# Patient Record
Sex: Female | Born: 1965 | Race: Black or African American | Hispanic: No | Marital: Married | Smoking: Never smoker
Health system: Southern US, Community
[De-identification: ages and names within clinical notes are randomized; demographics above are authoritative.]

## PROBLEM LIST (undated history)

## (undated) DIAGNOSIS — S42309A Unspecified fracture of shaft of humerus, unspecified arm, initial encounter for closed fracture: Secondary | ICD-10-CM

## (undated) DIAGNOSIS — D696 Thrombocytopenia, unspecified: Secondary | ICD-10-CM

## (undated) DIAGNOSIS — D649 Anemia, unspecified: Secondary | ICD-10-CM

## (undated) DIAGNOSIS — J302 Other seasonal allergic rhinitis: Secondary | ICD-10-CM

## (undated) DIAGNOSIS — R351 Nocturia: Secondary | ICD-10-CM

## (undated) HISTORY — DX: Anemia, unspecified: D64.9

## (undated) HISTORY — DX: Other seasonal allergic rhinitis: J30.2

## (undated) HISTORY — PX: COLONOSCOPY: SHX174

## (undated) HISTORY — PX: OTHER SURGICAL HISTORY: SHX169

## (undated) HISTORY — DX: Nocturia: R35.1

## (undated) HISTORY — DX: Thrombocytopenia, unspecified: D69.6

## (undated) HISTORY — DX: Unspecified fracture of shaft of humerus, unspecified arm, initial encounter for closed fracture: S42.309A

---

## 1998-07-28 ENCOUNTER — Ambulatory Visit (HOSPITAL_COMMUNITY): Admission: RE | Admit: 1998-07-28 | Discharge: 1998-07-28 | Payer: Self-pay | Admitting: Obstetrics and Gynecology

## 1998-08-01 ENCOUNTER — Inpatient Hospital Stay (HOSPITAL_COMMUNITY): Admission: AD | Admit: 1998-08-01 | Discharge: 1998-08-01 | Payer: Self-pay | Admitting: Obstetrics and Gynecology

## 1999-05-31 ENCOUNTER — Inpatient Hospital Stay (HOSPITAL_COMMUNITY): Admission: AD | Admit: 1999-05-31 | Discharge: 1999-05-31 | Payer: Self-pay | Admitting: Obstetrics and Gynecology

## 1999-06-01 ENCOUNTER — Ambulatory Visit (HOSPITAL_COMMUNITY): Admission: RE | Admit: 1999-06-01 | Discharge: 1999-06-01 | Payer: Self-pay | Admitting: Obstetrics and Gynecology

## 1999-06-01 ENCOUNTER — Encounter: Payer: Self-pay | Admitting: Obstetrics and Gynecology

## 1999-06-21 ENCOUNTER — Inpatient Hospital Stay (HOSPITAL_COMMUNITY): Admission: AD | Admit: 1999-06-21 | Discharge: 1999-06-24 | Payer: Self-pay | Admitting: Obstetrics and Gynecology

## 1999-06-21 ENCOUNTER — Encounter (INDEPENDENT_AMBULATORY_CARE_PROVIDER_SITE_OTHER): Payer: Self-pay | Admitting: *Deleted

## 1999-06-25 ENCOUNTER — Encounter (HOSPITAL_COMMUNITY): Admission: RE | Admit: 1999-06-25 | Discharge: 1999-07-26 | Payer: Self-pay | Admitting: Obstetrics and Gynecology

## 1999-08-03 ENCOUNTER — Other Ambulatory Visit: Admission: RE | Admit: 1999-08-03 | Discharge: 1999-08-03 | Payer: Self-pay | Admitting: Obstetrics and Gynecology

## 2000-05-11 ENCOUNTER — Other Ambulatory Visit: Admission: RE | Admit: 2000-05-11 | Discharge: 2000-05-11 | Payer: Self-pay | Admitting: Obstetrics and Gynecology

## 2001-05-31 ENCOUNTER — Other Ambulatory Visit: Admission: RE | Admit: 2001-05-31 | Discharge: 2001-05-31 | Payer: Self-pay | Admitting: Obstetrics and Gynecology

## 2002-06-06 ENCOUNTER — Other Ambulatory Visit: Admission: RE | Admit: 2002-06-06 | Discharge: 2002-06-06 | Payer: Self-pay | Admitting: Obstetrics and Gynecology

## 2003-06-19 ENCOUNTER — Other Ambulatory Visit: Admission: RE | Admit: 2003-06-19 | Discharge: 2003-06-19 | Payer: Self-pay | Admitting: Obstetrics and Gynecology

## 2004-06-10 ENCOUNTER — Encounter: Payer: Self-pay | Admitting: Cardiology

## 2004-06-10 ENCOUNTER — Ambulatory Visit (HOSPITAL_COMMUNITY): Admission: RE | Admit: 2004-06-10 | Discharge: 2004-06-10 | Payer: Self-pay | Admitting: Internal Medicine

## 2004-06-10 ENCOUNTER — Encounter: Payer: Self-pay | Admitting: Internal Medicine

## 2004-06-10 ENCOUNTER — Ambulatory Visit: Payer: Self-pay | Admitting: Cardiology

## 2004-07-07 ENCOUNTER — Other Ambulatory Visit: Admission: RE | Admit: 2004-07-07 | Discharge: 2004-07-07 | Payer: Self-pay | Admitting: Obstetrics & Gynecology

## 2005-06-23 ENCOUNTER — Encounter: Payer: Self-pay | Admitting: Internal Medicine

## 2005-06-23 LAB — CONVERTED CEMR LAB
ALT: 19 units/L
AST: 27 units/L
Albumin: 4.7 g/dL
Alkaline Phosphatase: 38 units/L
BUN: 13 mg/dL
CO2: 25 meq/L
Calcium: 9.8 mg/dL
Chloride: 102 meq/L
Cholesterol: 111 mg/dL
Creatinine, Ser: 1.1 mg/dL
Glucose, Bld: 82 mg/dL
HDL: 48 mg/dL
Hgb A1c MFr Bld: 5.4 %
LDL Cholesterol: 49 mg/dL
Potassium: 4.2 meq/L
Sodium: 136 meq/L
TSH: 1.846 microintl units/mL
Total Bilirubin: 0.5 mg/dL
Total Protein: 8.1 g/dL
Triglyceride fasting, serum: 70 mg/dL

## 2005-07-21 ENCOUNTER — Ambulatory Visit: Payer: Self-pay | Admitting: Gastroenterology

## 2005-08-04 ENCOUNTER — Ambulatory Visit: Payer: Self-pay | Admitting: Gastroenterology

## 2005-08-06 LAB — HM COLONOSCOPY: HM Colonoscopy: NORMAL

## 2006-06-29 ENCOUNTER — Encounter: Payer: Self-pay | Admitting: Internal Medicine

## 2006-06-29 LAB — CONVERTED CEMR LAB
ALT: 14 units/L
AST: 19 units/L
Albumin: 4.4 g/dL
Alkaline Phosphatase: 35 units/L
BUN: 14 mg/dL
Basophils Relative: 1 %
CO2: 25 meq/L
Calcium: 9.3 mg/dL
Chloride: 103 meq/L
Cholesterol: 127 mg/dL
Creatinine, Ser: 1.06 mg/dL
Eosinophils Relative: 2 %
Glucose, Bld: 83 mg/dL
HCT: 35.5 %
HDL: 48 mg/dL
Hemoglobin: 11.5 g/dL
Hgb A1c MFr Bld: 5.4 %
LDL Cholesterol: 66 mg/dL
Lymphocytes, automated: 44 %
MCV: 92.2 fL
Monocytes Relative: 8 %
Neutrophils Relative %: 45 %
Platelets: 179 10*3/uL
Potassium: 4.9 meq/L
RBC: 3.85 M/uL
RDW: 13.1 %
Sodium: 138 meq/L
TSH: 1.74 microintl units/mL
Total Bilirubin: 0.8 mg/dL
Total Protein: 7.6 g/dL
Triglyceride fasting, serum: 66 mg/dL
WBC: 3.7 10*3/uL

## 2007-07-05 ENCOUNTER — Encounter: Payer: Self-pay | Admitting: Internal Medicine

## 2009-09-15 ENCOUNTER — Ambulatory Visit: Payer: Self-pay | Admitting: Internal Medicine

## 2009-09-15 LAB — CONVERTED CEMR LAB
ALT: 20 units/L (ref 0–35)
AST: 25 units/L (ref 0–37)
Albumin: 4.1 g/dL (ref 3.5–5.2)
Alkaline Phosphatase: 36 units/L — ABNORMAL LOW (ref 39–117)
BUN: 19 mg/dL (ref 6–23)
Basophils Absolute: 0 10*3/uL (ref 0.0–0.1)
Basophils Relative: 0.5 % (ref 0.0–3.0)
Bilirubin Urine: NEGATIVE
Bilirubin, Direct: 0.2 mg/dL (ref 0.0–0.3)
CO2: 27 meq/L (ref 19–32)
Calcium: 9.2 mg/dL (ref 8.4–10.5)
Chloride: 108 meq/L (ref 96–112)
Cholesterol: 131 mg/dL (ref 0–200)
Creatinine, Ser: 1 mg/dL (ref 0.4–1.2)
Eosinophils Absolute: 0.1 10*3/uL (ref 0.0–0.7)
Eosinophils Relative: 2.5 % (ref 0.0–5.0)
GFR calc non Af Amer: 76.65 mL/min (ref >60)
Glucose, Bld: 85 mg/dL (ref 70–99)
HCT: 33.9 % — ABNORMAL LOW (ref 36.0–46.0)
HDL: 38.5 mg/dL — ABNORMAL LOW (ref >39.00)
Hemoglobin: 11.3 g/dL — ABNORMAL LOW (ref 12.0–15.0)
Ketones, ur: NEGATIVE mg/dL
LDL Cholesterol: 73 mg/dL (ref 0–99)
Leukocytes, UA: NEGATIVE
Lymphocytes Relative: 27.8 % (ref 12.0–46.0)
Lymphs Abs: 1.3 10*3/uL (ref 0.7–4.0)
MCHC: 33.3 g/dL (ref 30.0–36.0)
MCV: 93.8 fL (ref 78.0–100.0)
Monocytes Absolute: 0.3 10*3/uL (ref 0.1–1.0)
Monocytes Relative: 6.8 % (ref 3.0–12.0)
Neutro Abs: 3 10*3/uL (ref 1.4–7.7)
Neutrophils Relative %: 62.4 % (ref 43.0–77.0)
Nitrite: NEGATIVE
Platelets: 50 10*3/uL — ABNORMAL LOW (ref 150.0–400.0)
Potassium: 4.5 meq/L (ref 3.5–5.1)
RBC: 3.62 M/uL — ABNORMAL LOW (ref 3.87–5.11)
RDW: 12.7 % (ref 11.5–14.6)
Sodium: 140 meq/L (ref 135–145)
Specific Gravity, Urine: >=1.03 (ref 1.000–1.030)
TSH: 1.77 microintl units/mL (ref 0.35–5.50)
Total Bilirubin: 1 mg/dL (ref 0.3–1.2)
Total CHOL/HDL Ratio: 3
Total Protein, Urine: NEGATIVE mg/dL
Total Protein: 7.4 g/dL (ref 6.0–8.3)
Triglycerides: 99 mg/dL (ref 0.0–149.0)
Urine Glucose: NEGATIVE mg/dL
Urobilinogen, UA: 0.2 (ref 0.0–1.0)
VLDL: 19.8 mg/dL (ref 0.0–40.0)
WBC: 4.8 10*3/uL (ref 4.5–10.5)
pH: 5.5 (ref 5.0–8.0)

## 2009-09-21 ENCOUNTER — Ambulatory Visit: Payer: Self-pay | Admitting: Internal Medicine

## 2009-09-21 DIAGNOSIS — D696 Thrombocytopenia, unspecified: Secondary | ICD-10-CM

## 2009-09-22 DIAGNOSIS — D649 Anemia, unspecified: Secondary | ICD-10-CM | POA: Insufficient documentation

## 2009-09-29 ENCOUNTER — Telehealth: Payer: Self-pay | Admitting: Internal Medicine

## 2009-09-30 ENCOUNTER — Encounter: Payer: Self-pay | Admitting: Internal Medicine

## 2009-10-18 ENCOUNTER — Ambulatory Visit: Payer: Self-pay | Admitting: Internal Medicine

## 2009-10-18 LAB — CONVERTED CEMR LAB
Basophils Absolute: 0 10*3/uL (ref 0.0–0.1)
Basophils Relative: 0.8 % (ref 0.0–3.0)
Eosinophils Absolute: 0.1 10*3/uL (ref 0.0–0.7)
Eosinophils Relative: 2.2 % (ref 0.0–5.0)
HCT: 33.7 % — ABNORMAL LOW (ref 36.0–46.0)
Hemoglobin: 11.5 g/dL — ABNORMAL LOW (ref 12.0–15.0)
Lymphocytes Relative: 33.8 % (ref 12.0–46.0)
Lymphs Abs: 1.8 10*3/uL (ref 0.7–4.0)
MCHC: 34.2 g/dL (ref 30.0–36.0)
MCV: 93.4 fL (ref 78.0–100.0)
Monocytes Absolute: 0.5 10*3/uL (ref 0.1–1.0)
Monocytes Relative: 8.7 % (ref 3.0–12.0)
Neutro Abs: 2.9 10*3/uL (ref 1.4–7.7)
Neutrophils Relative %: 54.5 % (ref 43.0–77.0)
Platelets: 196 10*3/uL (ref 150.0–400.0)
RBC: 3.61 M/uL — ABNORMAL LOW (ref 3.87–5.11)
RDW: 13 % (ref 11.5–14.6)
WBC: 5.4 10*3/uL (ref 4.5–10.5)

## 2010-01-07 ENCOUNTER — Ambulatory Visit: Payer: Self-pay | Admitting: Internal Medicine

## 2010-01-07 DIAGNOSIS — R062 Wheezing: Secondary | ICD-10-CM

## 2010-04-10 LAB — CONVERTED CEMR LAB
ALT: 13 units/L
AST: 19 units/L
Albumin: 4.3 g/dL
Alkaline Phosphatase: 32 units/L
BUN: 13 mg/dL
Basophils Relative: 1 %
CO2: 24 meq/L
Calcium: 9.6 mg/dL
Chloride: 104 meq/L
Cholesterol: 124 mg/dL
Creatinine, Ser: 1 mg/dL
Eosinophils Relative: 1 %
Glucose, Bld: 77 mg/dL
HCT: 34.9 %
HDL: 55 mg/dL
Hemoglobin: 11.5 g/dL
Hgb A1c MFr Bld: 5.3 %
LDL Cholesterol: 56 mg/dL
Lymphocytes, automated: 42 %
MCV: 92.8 fL
Monocytes Relative: 10 %
Neutrophils Relative %: 47 %
Platelets: 205 10*3/uL
Potassium: 4.2 meq/L
RBC: 3.76 M/uL
RDW: 13.4 %
Sodium: 139 meq/L
TSH: 1.144 microintl units/mL
Total Bilirubin: 0.7 mg/dL
Total Protein: 7.6 g/dL
Triglyceride fasting, serum: 65 mg/dL
WBC: 4.9 10*3/uL

## 2010-04-14 NOTE — Assessment & Plan Note (Signed)
Summary: npx-lb   Vital Signs:  Patient profile:   45 year old female Height:      64 inches (162.56 cm) Weight:      164.0 pounds (74.55 kg) BMI:     28.25 O2 Sat:      97 % on Room air Temp:     98.5 degrees F (36.94 degrees C) oral Pulse rate:   64 / minute BP sitting:   120 / 72  (left arm) Cuff size:   regular  Vitals Entered By: Orlan Leavens (September 21, 2009 2:36 PM)  O2 Flow:  Room air CC: New patient CPX Is Patient Diabetic? No Pain Assessment Patient in pain? no        Primary Care Provider:  Newt Lukes MD  CC:  New patient CPX.  History of Present Illness: new pt to me and our practice, here to es care also, patient is here today for annual physical. Patient feels well and has no complaints.   thrombocytopenia - noted incidental on labs - pt denies hx same anemia - chronic hx same per pt - never required transfusion   Preventive Screening-Counseling & Management  Alcohol-Tobacco     Alcohol drinks/day: <1     Alcohol Counseling: not indicated; use of alcohol is not excessive or problematic     Smoking Status: never     Tobacco Counseling: not indicated; no tobacco use  Caffeine-Diet-Exercise     Diet Counseling: not indicated; diet is assessed to be healthy     Does Patient Exercise: yes     Exercise Counseling: not indicated; exercise is adequate     Depression Counseling: not indicated; screening negative for depression  Safety-Violence-Falls     Seat Belt Use: yes     Seat Belt Counseling: not indicated; patient wears seat belts     Helmet Use: yes     Helmet Counseling: not indicated; patient wears helmet when riding bicycle/motocycle     Firearms in the Home: firearms in the home     Firearm Counseling: not indicated; uses recommended firearm safety measures     Smoke Detectors: yes     Smoke Detector Counseling: n/a     Violence in the Home: no risk noted     Sexual Abuse: no     Fall Risk Counseling: not indicated; no significant  falls noted  Clinical Review Panels:  Prevention   Last Mammogram:  No specific mammographic evidence of malignancy.   (02/19/2009)   Last Pap Smear:  Interpretation/ Result:Negative for intraepithelial Lesion or Malignancy.    (02/19/2009)   Last Colonoscopy:  Location:  [facility].  Results: Normal. (08/04/2005)  Immunizations   Last Tetanus Booster:  Tdap (09/21/2009)  Lipid Management   Cholesterol:  131 (09/15/2009)   LDL (bad choesterol):  73 (09/15/2009)   HDL (good cholesterol):  38.50 (09/15/2009)  CBC   WBC:  4.8 (09/15/2009)   RBC:  3.62 (09/15/2009)   Hgb:  11.3 (09/15/2009)   Hct:  33.9 (09/15/2009)   Platelets:  50.0 (09/15/2009)   MCV  93.8 (09/15/2009)   MCHC  33.3 (09/15/2009)   RDW  12.7 (09/15/2009)   PMN:  62.4 (09/15/2009)   Lymphs:  27.8 (09/15/2009)   Monos:  6.8 (09/15/2009)   Eosinophils:  2.5 (09/15/2009)   Basophil:  0.5 (09/15/2009)  Complete Metabolic Panel   Glucose:  85 (09/15/2009)   Sodium:  140 (09/15/2009)   Potassium:  4.5 (09/15/2009)   Chloride:  108 (09/15/2009)  CO2:  27 (09/15/2009)   BUN:  19 (09/15/2009)   Creatinine:  1.0 (09/15/2009)   Albumin:  4.1 (09/15/2009)   Total Protein:  7.4 (09/15/2009)   Calcium:  9.2 (09/15/2009)   Total Bili:  1.0 (09/15/2009)   Alk Phos:  36 (09/15/2009)   SGPT (ALT):  20 (09/15/2009)   SGOT (AST):  25 (09/15/2009)   Current Medications (verified): 1)  One-A-Day Womens Formula  Tabs (Multiple Vitamins-Calcium) .... 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         considering breast reduction surg due to back and shoulder and neck pain; otherwise, see HPI above. I have reviewed all other systems and they were negative.   Physical Exam  General:  overweight-appearing.  alert, well-developed, well-nourished, and cooperative to examination.    Eyes:  vision grossly intact; pupils equal, round and reactive to light.  conjunctiva and lids normal.    Ears:  normal pinnae bilaterally, without erythema, swelling, or tenderness to palpation. TMs clear, without effusion, or cerumen impaction. Hearing grossly normal bilaterally  Nose:  External nasal examination shows no deformity or inflammation. Nasal mucosa are pink and moist without lesions or exudates. Mouth:  teeth and gums in good repair; mucous membranes moist, without lesions or ulcers. oropharynx clear without exudate, no erythema.  Neck:  supple, full ROM, no masses, no thyromegaly; no thyroid nodules or tenderness. no JVD or carotid bruits.   Lungs:  normal respiratory effort, no intercostal retractions or use of accessory muscles; normal breath sounds bilaterally - no crackles and no wheezes.    Heart:  normal rate, regular rhythm, no murmur, and no rub. BLE without edema.  Abdomen:  soft, non-tender, normal bowel sounds, no distention; no masses and no appreciable hepatomegaly or splenomegaly.   Genitalia:  defer to gyn Msk:  No deformity or scoliosis noted of thoracic or lumbar  spine.   Neurologic:  alert & oriented X3 and cranial nerves II-XII symetrically intact.  strength normal in all extremities, sensation intact to light touch, and gait normal. speech fluent without dysarthria or aphasia; follows commands with good comprehension.  Skin:  no rashes, vesicles, ulcers, or erythema. No nodules or irregularity to palpation. no ecchymoses and no petechiae.   Psych:  Oriented X3, memory intact for recent and remote, normally interactive, good eye contact, not anxious appearing, not depressed appearing, and not agitated.      Impression & Recommendations:  Problem # 1:  PREVENTIVE HEALTH CARE (ICD-V70.0) Patient has been counseled on age-appropriate routine health concerns for screening and prevention. These are reviewed and up-to-date. Immunizations are up-to-date or declined. Labs and  ECG reviewed.  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.5) new problem identified on CPX labs - pt unaware of any prior hx same - suspect chronic as no recent systemic iillness nad nontoxic/benign exam today - likely ITP will send for prior recrds from PCP to review - also recheck CBC 4-6 weeks - eval further as needed  advised pt to contact us sooner if systemic symptoms arise --  Problem # 3:  UNSPECIFIED ANEMIA (ICD-285.9) mild with hx same  recheck CBC few weeks as above given low plts -   Hgb: 11.3 (09/15/2009)   Hct: 33.9 (09/15/2009)   Platelets: 50.0 (09/15/2009) RBC: 3.62 (09/15/2009)   RDW: 12.7 (09/15/2009)   WBC: 4.8 (09/15/2009) MCV: 93.8 (09/15/2009)   MCHC: 33.3 (09/15/2009) TSH: 1.77 (09/15/2009)  Complete Medication List: 1)  One-a-day Womens Formula Tabs (Multiple vitamins-calcium) .... Take 1 by mouth once daily  Other Orders: Tdap => 56yrs IM (78469) Admin 1st Vaccine (62952)  Patient Instructions: 1)  it was good to see you today. 2)  exam, labs and EKG reviewed today - will look further into your low platelet  count on CBC as discussed - 3)  will send for prior records from Dr. Madelin Rear to review and compare 4)  return for lab only in 4-6 weeks (CBC  icd9: 287.5,2 85.9 ) - will call you with these results and workup plan (if needed) after review 5)  Please schedule a follow-up appointment annually for medical physical and labs, sooner if problems.  6)  stay active, exercise aerobically 67minutes/spell 4-5 times weekly - eat heart healthy diet   Mammogram  Procedure date:  02/19/2009  Findings:      No specific mammographic evidence of malignancy.    Pap Smear  Procedure date:  02/19/2009  Findings:      Interpretation/ Result:Negative for intraepithelial Lesion or Malignancy.     Colonoscopy  Procedure date:  08/04/2005  Findings:      Location:  [facility].  Results: Normal.     Immunizations Administered:  Tetanus Vaccine:    Vaccine Type: Tdap    Site: right deltoid    Mfr: GlaxoSmithKline    Dose: 0.5 ml    Route: IM    Given by: Orlan Leavens    Exp. Date: 06/04/2011    Lot #: [id number]    VIS given: 09/21/09

## 2010-04-14 NOTE — Assessment & Plan Note (Signed)
Summary: COUGH/ CHEST TIGHTNESS/ CONGESTION/NWS   Vital Signs:  Patient profile:   45 year old female Height:      64 inches Weight:      163.50 pounds O2 Sat:      97 % Temp:     98.1 degrees F oral Pulse rate:   64 / minute BP sitting:   130 / 70  (left arm) Cuff size:   large  Vitals Entered By: Jarome Lamas (January 07, 2010 3:33 PM) CC: cough and congestion/pb   Primary Care Provider:  Newt Lukes MD  CC:  cough and congestion/pb.  History of Present Illness: here with acute onset midl to mod headache, fever, ST and prod cough greenish sputum for 3 days, with onset today mild wheezing and sob, but Pt denies CP, wheezing, orthopnea, pnd, worsening LE edema, palps, dizziness or syncope . Pt denies polydipsia, polyuria.  Overall good compliance with meds.  Tried mucinex but little help so far.    Problems Prior to Update: 1)  Wheezing  (ICD-786.07) 2)  Bronchitis-acute  (ICD-466.0) 3)  Unspecified Anemia  (ICD-285.9) 4)  Thrombocytopenia  (ICD-287.5) 5)  Preventive Health Care  (ICD-V70.0) 6)  Family History Diabetes 1st Degree Relative  (ICD-V18.0)  Medications Prior to Update: 1)  One-A-Day Womens Formula  Tabs (Multiple Vitamins-Calcium) .... Take 1 By Mouth Once Daily  Current Medications (verified): 1)  One-A-Day Womens Formula  Tabs (Multiple Vitamins-Calcium) .... Take 1 By Mouth Once Daily 2)  Azithromycin 250 Mg Tabs (Azithromycin) .... 2po Qd For 1 Day, Then 1po Qd For 4days, Then Stop 3)  Prednisone 10 Mg Tabs (Prednisone) .... 3po Qd For 3days, Then 2po Qd For 3days, Then 1po Qd For 3days, Then Stop 4)  Hydrocodone-Homatropine 5-1.5 Mg/51ml Syrp (Hydrocodone-Homatropine) .... 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  , mild ill  Head:  normocephalic and atraumatic.   Eyes:  vision grossly intact, pupils equal, and pupils round.   Ears:  bilat tm's mild red, sinus nontender Nose:  nasal dischargemucosal pallor and mucosal edema.   Mouth:  pharyngeal erythema and fair dentition.   Neck:  supple and cervical lymphadenopathy.   Lungs:  normal respiratory effort, R decreased breath sounds, R wheezes, L decreased breath sounds, and L wheezes.   Heart:  normal rate and regular rhythm.   Extremities:  no edema, no erythema    Impression & Recommendations:  Problem # 1:  BRONCHITIS-ACUTE (ICD-466.0)  Her updated medication list for this problem includes:    Azithromycin 250 Mg Tabs (Azithromycin) .Marland Kitchen... 2po qd for 1 day, then 1po qd for 4days, then stop    Hydrocodone-homatropine 5-1.5 Mg/28ml Syrp (Hydrocodone-homatropine) .Marland Kitchen... 1/2 - 1 tsp by mouth q 6 hrs as needed cough treat as above, f/u any worsening signs or symptoms   Problem #  2:  WHEEZING (ICD-786.07) mild, due to above, for predpack asd   Complete Medication List: 1)  One-a-day Womens Formula Tabs (Multiple vitamins-calcium) .... Take 1 by mouth once daily 2)  Azithromycin 250 Mg Tabs (Azithromycin) .... 2po qd for 1 day, then 1po qd for 4days, then stop 3)  Prednisone 10 Mg Tabs (Prednisone) .... 3po qd for 3days, then 2po qd for 3days, then 1po qd for 3days, then stop 4)  Hydrocodone-homatropine 5-1.5 Mg/67ml Syrp (Hydrocodone-homatropine) .... 1/2 - 1 tsp by mouth q 6 hrs as needed  cough  Patient Instructions: 1)  Please take all new medications as prescribed 2)  Continue all previous medications as before this visit  3)  Please schedule a follow-up appointment as needed. Prescriptions: HYDROCODONE-HOMATROPINE 5-1.5 MG/5ML SYRP (HYDROCODONE-HOMATROPINE) 1/2 - 1 tsp by mouth q 6 hrs as needed cough  #6oz x 1   Entered and Authorized by:   Corwin Levins MD   Signed by:   Corwin Levins MD on 01/07/2010   Method used:   Print then Give to Patient   RxID:   [id number] PREDNISONE 10 MG TABS (PREDNISONE) 3po qd for 3days, then 2po qd for 3days, then 1po qd for 3days, then stop  #18 x 0   Entered and Authorized by:   Corwin Levins MD   Signed by:   Corwin Levins MD on 01/07/2010   Method used:   Print then Give to Patient   RxID:   [id number] AZITHROMYCIN 250 MG TABS (AZITHROMYCIN) 2po qd for 1 day, then 1po qd for 4days, then stop  #6 x 1   Entered and Authorized by:   Corwin Levins MD   Signed by:   Corwin Levins MD on 01/07/2010   Method used:   Print then Give to Patient   RxID:   [id number]    Orders Added: 1)  Est. Patient Level IV [74259]

## 2010-04-14 NOTE — Progress Notes (Signed)
Summary: med records  Phone Note Outgoing Call   Call placed by: Orlan Leavens,  September 29, 2009 2:40 PM Call placed to: Patient Summary of Call: Called pt to let her know md recieved records that was drop off for her and her husband yesterday. MD has made copy of everything that she need. calling them so they can pick chart back up for there future references. Had to leave msg to give me a call back. Initial call taken by: Orlan Leavens,  September 29, 2009 2:42 PM  Follow-up for Phone Call        Pt return call back. Let her know records will be left at front desk for her to pick back up Follow-up by: Orlan Leavens,  September 29, 2009 2:57 PM

## 2010-04-14 NOTE — Miscellaneous (Signed)
Summary: Orders Update  Clinical Lists Changes  Orders: Added new Service order of Est. Patient Level III (04540) - Signed     instead of the level 4 - done in error Corwin Levins MD  January 07, 2010 7:46 PM

## 2010-07-29 NOTE — Discharge Summary (Signed)
[facility]  Patient:    Brandy Salas, Brandy Salas                MRN: [id number] Adm. Date:  [id number] Disc. Date: [id number] Attending:  Miguel Aschoff Dictator:   Leilani Able, P.A.                           Discharge Summary  FINAL DIAGNOSES:              1. Intrauterine twin pregnancy at [redacted] weeks gestation.                               2. Vertex/breech presentation.  PROCEDURE:                    Primary low transverse cesarean section.  SURGEON:                      Miguel Aschoff, M.D.  ASSISTANT:                    Luvenia Redden, M.D.  COMPLICATIONS:                None.  HISTORY OF PRESENT ILLNESS:   This 45 year old, gravida 5, para 1-0-3-1, conceived on Clomid with this pregnancy and now has an intrauterine twin gestation with an [facility] of July 04, 1999.  Ultrasound has revealed that baby A was vertex and baby B is breech.  Because of the patients concerns of undergoing a cesarean section for the delivery of the second twin, the patient requested a primary cesarean section be carried out at this point.  HOSPITAL COURSE:              The patient was admitted on June 21, 1999, and taken to the operating room by Miguel Aschoff, M.D. for a primary low transverse cesarean  section.  The patient had delivery of twin A who was a 5 pound 9 ounce female infant with Apgars of 9 and 9, twin B was also a female infant weighing 5 pounds 11 ounces with Apgars of 8 and 9.  The pathology report showed a diamniotic, monochorionic twin placenta.  The deliveries went without complications.  The patients postoperative course was benign without significant fevers.  The patient was felt ready for discharge on postoperative day #3.  She was sent home on a regular diet, told to decrease activities, told to continue prenatal vitamins, nd FeSO4 for some maternal anemia.  She was given Tylox #30 one to two every four hours as needed for pain, and told to follow  up in the office in four weeks.  DISCHARGE LABORATORY DATA:    The patient had a hemoglobin of 8.2, white blood ell count of 7.7. DD:  07/10/99 TD:  07/13/99 Job: [id number]

## 2010-07-29 NOTE — Op Note (Signed)
[facility]  Patient:    Brandy Salas, Brandy Salas                MRN: [id number] Proc. Date: 06/21/99 Adm. Date:  [id number] Attending:  Miguel Aschoff                           Operative Report  OFFICE CHART NUMBER:          [id number]  PREOPERATIVE DIAGNOSIS:       Intrauterine twin pregnancy at 38 weeks with vertex/breech presentation.  POSTOPERATIVE DIAGNOSIS:      Intrauterine twin pregnancy at 38 weeks with vertex/breech presentation.  With delivery of viable female infants, baby A Apgars 8 and 9, baby B Apgars 8 and 9.  OPERATION:                    Primary low transverse cesarean section.  SURGEON:                      Miguel Aschoff, M.D.  ASSISTANT:                    Luvenia Redden, M.D.  ANESTHESIA:                   Epidural.  COMPLICATIONS:                None.  ESTIMATED BLOOD LOSS:  INDICATIONS:                  The patient is a 45 year old black female, gravida 5, para 1-0-3-1, who conceived on Clomid and now has intrauterine twin gestation with an [facility] of July 04, 1999.  Ultrasound revealed the baby A to be vertex and baby B to be breech.  Because of the patients concerns of undergoing a cesarean section for the delivery of the second twin, the patient requests that a primary cesarean section be carried out rather than undergoing emergency cesarean section for delivery of the second infant.  After the risks and benefits of each mode of delivery were discussed with the patient, she opted to undergo elective primary  cesarean section.  Informed consent has been obtained.  DESCRIPTION OF PROCEDURE:     The patient was taken to the operating room and placed in the supine position and then placed on her right side.  The epidural anesthesia was placed without difficulty.  After a satisfactory level of anesthesia was achieved, she was prepped and draped in the usual sterile fashion.  A Foley  catheter was inserted.  At this point a  Pfannenstiel incision was made and extended down through the subcutaneous tissue with bleeding points being clamped and coagulated as they were encountered.  The fascia was then identified and incised transversely and separated from the underlying rectus muscles.  The rectus muscles were divided in the midline.  The peritoneum was then identified and entered carefully avoiding underlying structures.  The peritoneal incision was then extended under direct visualization.  The bladder flap was then created with the and protected with the bladder blade.  An elliptical transverse incision was made into the lower uterine segment.  The amniotic cavity of baby A was entered and his baby was delivered as a viable female infant, Apgars 8 at one minute and 9 at five minutes from a vertex LOP position.  The baby was handed to the pediatric team n attendance.  Baby B was then found to be transverse and was easily rotated into a vertex presentation and delivered without difficulty.  It was also a viable female infant, Apgars 8 at one minute and 9 at five minutes.  Cord bloods were then obtained for appropriate studies.  The uterus was evacuated of any remaining products of conception.  The angles of the uterine incision were then ligated using figure-of-eight sutures of #1 Vicryl.  The uterus was closed in layers.  The first layer was a running interlocking suture of #1 Vicryl followed by an imbricating  suture of #1 Vicryl.  The bladder flap was reapproximated using running continuous 2-0 Vicryl suture.  The ovaries were inspected and noted to be within normal limits.  The right tube was noted to be within normal limits.  Lap counts were taken and found to be correct and at this point the abdomen was closed.  Prior o closure, it was irrigated with copious amounts of warm saline and the parietoperitoneum was closed using running continuous 0 Vicryl suture.  The rectus muscles were  reapproximated using running continuous 0 Vicryl suture.  The fascia was closed using two sutures of 0 Vicryl each starting at the lateral fascial angles and meeting in the midline.  The subcutaneous tissue and skin were closed using staples.  The estimated blood loss was approximately 700 to 800 cc.  The patient tolerated the procedure well and went to the recovery room in satisfactory condition.  The babies were taken to the nursery in satisfactory condition. DD:  06/21/99 TD:  06/21/99 Job: [id number]

## 2010-09-20 ENCOUNTER — Encounter: Payer: Self-pay | Admitting: Internal Medicine

## 2010-09-20 ENCOUNTER — Other Ambulatory Visit (INDEPENDENT_AMBULATORY_CARE_PROVIDER_SITE_OTHER): Payer: Self-pay

## 2010-09-20 ENCOUNTER — Other Ambulatory Visit: Payer: Self-pay | Admitting: Internal Medicine

## 2010-09-20 DIAGNOSIS — Z0000 Encounter for general adult medical examination without abnormal findings: Secondary | ICD-10-CM

## 2010-09-20 LAB — URINALYSIS
Bilirubin Urine: NEGATIVE
Hgb urine dipstick: NEGATIVE
Ketones, ur: NEGATIVE
Nitrite: NEGATIVE
Total Protein, Urine: NEGATIVE
Urine Glucose: NEGATIVE
pH: 6.5 (ref 5.0–8.0)

## 2010-09-20 LAB — LIPID PANEL
Cholesterol: 138 mg/dL (ref 0–200)
HDL: 49.3 mg/dL (ref >39.00)
Triglycerides: 64 mg/dL (ref 0.0–149.0)
VLDL: 12.8 mg/dL (ref 0.0–40.0)

## 2010-09-20 LAB — CBC WITH DIFFERENTIAL/PLATELET
Basophils Absolute: 0.1 10*3/uL (ref 0.0–0.1)
Eosinophils Absolute: 0.1 10*3/uL (ref 0.0–0.7)
Hemoglobin: 11.4 g/dL — ABNORMAL LOW (ref 12.0–15.0)
Lymphocytes Relative: 31.4 % (ref 12.0–46.0)
MCHC: 34.3 g/dL (ref 30.0–36.0)
Monocytes Relative: 9.9 % (ref 3.0–12.0)
Neutro Abs: 2.7 10*3/uL (ref 1.4–7.7)
Platelets: 181 10*3/uL (ref 150.0–400.0)
RDW: 13.7 % (ref 11.5–14.6)

## 2010-09-20 LAB — HEPATIC FUNCTION PANEL
ALT: 25 U/L (ref 0–35)
AST: 29 U/L (ref 0–37)
Albumin: 4.2 g/dL (ref 3.5–5.2)
Alkaline Phosphatase: 32 U/L — ABNORMAL LOW (ref 39–117)
Total Protein: 7.4 g/dL (ref 6.0–8.3)

## 2010-09-20 LAB — BASIC METABOLIC PANEL
Calcium: 9.2 mg/dL (ref 8.4–10.5)
GFR: 72.93 mL/min (ref >60.00)
Glucose, Bld: 75 mg/dL (ref 70–99)
Potassium: 4.4 mEq/L (ref 3.5–5.1)
Sodium: 139 mEq/L (ref 135–145)

## 2010-09-20 LAB — TSH: TSH: 1.94 u[IU]/mL (ref 0.35–5.50)

## 2010-09-26 ENCOUNTER — Encounter: Payer: Self-pay | Admitting: Internal Medicine

## 2010-10-06 ENCOUNTER — Encounter: Payer: Self-pay | Admitting: Internal Medicine

## 2010-10-06 ENCOUNTER — Ambulatory Visit (INDEPENDENT_AMBULATORY_CARE_PROVIDER_SITE_OTHER): Payer: 59 | Admitting: Internal Medicine

## 2010-10-06 VITALS — BP 128/88 | HR 72 | Temp 98.5°F | Ht 64.0 in | Wt 166.4 lb

## 2010-10-06 DIAGNOSIS — Z0000 Encounter for general adult medical examination without abnormal findings: Secondary | ICD-10-CM

## 2010-10-06 DIAGNOSIS — E663 Overweight: Secondary | ICD-10-CM

## 2010-10-06 MED ORDER — PHENTERMINE HCL 37.5 MG PO CAPS: 37.5000 mg | ORAL_CAPSULE | ORAL | Status: DC

## 2010-10-06 NOTE — Assessment & Plan Note (Signed)
Pt request phentermine to help "kick start" new plan to lose 20# -  reviewed risk/benefit discussed - max 3 consecutive months and need for recheck of weight and BP, symptoms review on meds - pt understands and agrees to same - 1st of 3 rx done today  Wt Readings from Last 3 Encounters:  10/06/10 166 lb 6.4 oz (75.479 kg)  01/07/10 163 lb 8 oz (74.163 kg)  09/21/09 164 lb (74.39 kg)

## 2010-10-06 NOTE — Progress Notes (Signed)
Subjective:    Patient ID: Brandy Salas, female    DOB: [birth year], 45 y.o.   MRN: [id number]  HPI patient is here today for annual physical. Patient feels well and has no complaints.  Overweight - would like med help to lose 20# - considering diet/exercise plans but none started yet  Past Medical History  Diagnosis Date  . THROMBOCYTOPENIA   . Anemia    Family History  Problem Relation Age of Onset  . Diabetes Mother   . Hypertension Mother   . Hypertension Father   . Diabetes Other     Parent  . Hypertension Other     parent, grandparent  . Prostate cancer Other     grandparent   History  Substance Use Topics  . Smoking status: Never Smoker   . Smokeless tobacco: Not on file   Comment: Married lives with spouse, teenage son and twin dtrs  . Alcohol Use: No   Review of Systems Constitutional: Negative for fever.  Respiratory: Negative for cough and shortness of breath.   Cardiovascular: Negative for chest pain.  Gastrointestinal: Negative for abdominal pain.  Musculoskeletal: Negative for gait problem.  Skin: Negative for rash.  Neurological: Negative for dizziness.  No other specific complaints in a complete review of systems (except as listed in HPI above).     Objective:   Physical Exam BP 128/88  Pulse 72  Temp(Src) 98.5 F (36.9 C) (Oral)  Ht 5\' 4"  (1.626 m)  Wt 166 lb 6.4 oz (75.479 kg)  BMI 28.56 kg/m2  SpO2 98% Wt Readings from Last 3 Encounters:  10/06/10 166 lb 6.4 oz (75.479 kg)  01/07/10 163 lb 8 oz (74.163 kg)  09/21/09 164 lb (74.39 kg)   Physical Exam  Constitutional: She is oriented to person, place, and time. She appears well-developed and well-nourished. No distress.  HENT: Head: Normocephalic and atraumatic. Ears; B TMs ok, no erythema or effusion; Nose: Nose normal.  Mouth/Throat: Oropharynx is clear and moist. No oropharyngeal exudate.  Eyes: Conjunctivae and EOM are normal. Pupils are equal, round, and reactive to light.  No scleral icterus.  Neck: Normal range of motion. Neck supple. No JVD present. No thyromegaly present.  Cardiovascular: Normal rate, regular rhythm and normal heart sounds.  No murmur heard. No BLE edema. Pulmonary/Chest: Effort normal and breath sounds normal. No respiratory distress. She has no wheezes.  Abdominal: Soft. Bowel sounds are normal. She exhibits no distension. There is no tenderness.  Musculoskeletal: Normal range of motion, no joint effusions. No gross deformities Neurological: She is alert and oriented to person, place, and time. No cranial nerve deficit. Coordination normal.  Skin: Skin is warm and dry. No rash noted. No erythema.  Psychiatric: She has a normal mood and affect. Her behavior is normal. Judgment and thought content normal.   Lab Results  Component Value Date   WBC 4.8 09/20/2010   HGB 11.4* 09/20/2010   HCT 33.3* 09/20/2010   PLT 181.0 09/20/2010   CHOL 138 09/20/2010   TRIG 64.0 09/20/2010   HDL 49.30 09/20/2010   ALT 25 09/20/2010   AST 29 09/20/2010   NA 139 09/20/2010   K 4.4 09/20/2010   CL 107 09/20/2010   CREATININE 1.0 09/20/2010   BUN 12 09/20/2010   CO2 28 09/20/2010   TSH 1.94 09/20/2010   HGBA1C 5.3 07/05/2007   EKG -NSR 66bpm - nonsp T wave change     Assessment & Plan:  CPX - v70.0 - Patient has been  counseled on age-appropriate routine health concerns for screening and prevention. These are reviewed and up-to-date. Immunizations are up-to-date or declined. Labs and ECG reviewed.

## 2010-10-06 NOTE — Patient Instructions (Signed)
It was good to see you today. Exam, EKG and labs look great - Ok to use phentermine to help stimulate weight loss efforts - 30day supply given today (1st of up to 3 months as discussed) Will needed recheck office visit for weight, blood pressure check and symptoms review before refills will be given Also work on lifestyle changes as discussed (low fat, low carb, increased protein diet; improved exercise efforts; weight loss) to control sugar, blood pressure and cholesterol levels and/or reduce risk of developing other medical problems. Look into [email or web address][employer][email or web address] or other type of food journal to assist you in this process. Please schedule followup in 12 months for physical and labs, call sooner if problems.

## 2011-04-24 ENCOUNTER — Ambulatory Visit (INDEPENDENT_AMBULATORY_CARE_PROVIDER_SITE_OTHER): Payer: 59 | Admitting: Internal Medicine

## 2011-04-24 ENCOUNTER — Encounter: Payer: Self-pay | Admitting: *Deleted

## 2011-04-24 ENCOUNTER — Encounter: Payer: Self-pay | Admitting: Internal Medicine

## 2011-04-24 VITALS — BP 122/82 | HR 79 | Temp 98.4°F

## 2011-04-24 DIAGNOSIS — J069 Acute upper respiratory infection, unspecified: Secondary | ICD-10-CM

## 2011-04-24 MED ORDER — IPRATROPIUM BROMIDE 0.03 % NA SOLN: 2.0000 | Freq: Three times a day (TID) | NASAL | Status: DC

## 2011-04-24 MED ORDER — AZITHROMYCIN 250 MG PO TABS: ORAL_TABLET | ORAL | Status: AC

## 2011-04-24 MED ORDER — HYDROCOD POLST-CHLORPHEN POLST 10-8 MG/5ML PO LQCR: 5.0000 mL | Freq: Two times a day (BID) | ORAL | Status: DC | PRN

## 2011-04-24 NOTE — Patient Instructions (Signed)
It was good to see you today. If you develop worsening symptoms or fever,  we can reconsider antibiotics (printed prescription for Zpak given to you today to use if symptoms worse), but it does not appear necessary to use antibiotics at this time. Atrovent nasal spray and Tussionex cough syrup - Your prescription(s) have been submitted to your pharmacy. Please take as directed and contact our office if you believe you are having problem(s) with the medication(s). Work note as requested Alternate between ibuprofen and tylenol for aches, pain and fever symptoms as discussed Hydrate, rest and call if worse

## 2011-04-24 NOTE — Progress Notes (Signed)
  Subjective:    HPI  complains of cold symptoms  Onset >1 week ago, wax/wane symptoms  associated with rhinorrhea, sneezing, sore throat, mild headache and low grade fever Also myalgias, sinus pressure and mild-mod chest congestion No relief with OTC meds Precipitated by sick contacts  Past Medical History  Diagnosis Date  . THROMBOCYTOPENIA   . Anemia     Review of Systems Constitutional: No fever or night sweats, no unexpected weight change Pulmonary: No pleurisy or hemoptysis Cardiovascular: No chest pain or palpitations     Objective:   Physical Exam BP 122/82  Pulse 79  Temp(Src) 98.4 F (36.9 C) (Oral)  SpO2 99% GEN: mildly ill appearing and audible head/chest congestion HENT: NCAT, mild sinus tenderness bilaterally, nares with clear discharge, oropharynx mild erythema, no exudate Eyes: Vision grossly intact, no conjunctivitis Lungs: Clear to auscultation without rhonchi or wheeze, no increased work of breathing Cardiovascular: Regular rate and rhythm, no bilateral edema  Lab Results  Component Value Date   WBC 4.8 09/20/2010   HGB 11.4* 09/20/2010   HCT 33.3* 09/20/2010   PLT 181.0 09/20/2010   GLUCOSE 75 09/20/2010   CHOL 138 09/20/2010   TRIG 64.0 09/20/2010   HDL 49.30 09/20/2010   LDLCALC 76 09/20/2010   ALT 25 09/20/2010   AST 29 09/20/2010   NA 139 09/20/2010   K 4.4 09/20/2010   CL 107 09/20/2010   CREATININE 1.0 09/20/2010   BUN 12 09/20/2010   CO2 28 09/20/2010   TSH 1.94 09/20/2010   HGBA1C 5.3 07/05/2007       Assessment & Plan:  Viral URI  Cough, postnasal drip related to above   Explained lack of efficacy for antibiotics in viral disease, but written prescription for Zpak prescribed to use if symptom duration greater than 7 days Prescription cough suppression and atrovent nasal spray - new prescriptions done Symptomatic care with Tylenol or Advil, hydration and rest -  salt gargle advised as needed Work note provided as needed

## 2011-05-29 ENCOUNTER — Ambulatory Visit (INDEPENDENT_AMBULATORY_CARE_PROVIDER_SITE_OTHER): Payer: 59 | Admitting: Internal Medicine

## 2011-05-29 ENCOUNTER — Encounter: Payer: Self-pay | Admitting: Internal Medicine

## 2011-05-29 ENCOUNTER — Ambulatory Visit (INDEPENDENT_AMBULATORY_CARE_PROVIDER_SITE_OTHER)
Admission: RE | Admit: 2011-05-29 | Discharge: 2011-05-29 | Disposition: A | Discharge status: Home / Self Care | Payer: 59 | Source: Ambulatory Visit | Attending: Internal Medicine | Admitting: Internal Medicine

## 2011-05-29 VITALS — BP 130/88 | HR 69 | Temp 97.4°F | Ht 66.0 in | Wt 155.0 lb

## 2011-05-29 DIAGNOSIS — R05 Cough: Secondary | ICD-10-CM

## 2011-05-29 MED ORDER — HYDROCOD POLST-CHLORPHEN POLST 10-8 MG/5ML PO LQCR: 5.0000 mL | Freq: Two times a day (BID) | ORAL | Status: DC | PRN

## 2011-05-29 NOTE — Patient Instructions (Signed)
It was good to see you today. Tussionex cough syrup - use before bedtime x 1 week, then as needed- Your prescription(s) have been submitted to your pharmacy. Please take as directed and contact our office if you believe you are having problem(s) with the medication(s). Mucinex during day x 1 week, then as needed Test(s) ordered today. Your results will be called to you after review (48-72hours after test completion). If any changes need to be made, you will be notified at that time. If symptoms unimproved in next few days on this treatment, call for prednisone taper as discussed

## 2011-05-29 NOTE — Progress Notes (Signed)
  Subjective:    Patient ID: Brandy Salas, female    DOB: [birth year], 46 y.o.   MRN: [id number]  HPI complains of continued cough Seen here 04/24/11 for same - did not ever fill Tussionex or Atrovent nasal spray - Took Zpak last week - no change in cough symptoms Runny nose and myalgias have resolved - persisting fatigue New dyspnea on exertion and shortness of breath No hx asthma, + allergies  Past Medical History  Diagnosis Date  . THROMBOCYTOPENIA   . Anemia     Review of Systems  Constitutional: Negative for fever and unexpected weight change.  HENT: Positive for postnasal drip. Negative for sneezing and sinus pressure.   Respiratory: Positive for cough and shortness of breath. Negative for wheezing.        Objective:   Physical Exam BP 130/88  Pulse 69  Temp(Src) 97.4 F (36.3 C) (Oral)  Ht 5\' 6"  (1.676 m)  Wt 155 lb (70.308 kg)  BMI 25.02 kg/m2  SpO2 99% Wt Readings from Last 3 Encounters:  05/29/11 155 lb (70.308 kg)  10/06/10 166 lb 6.4 oz (75.479 kg)  01/07/10 163 lb 8 oz (74.163 kg)    General: Nontoxic, in no acute distress HENT: Sinuses nontender, oropharynx with clear white postnasal drip, red but no exudate - TMs clear bilaterally Eyes: No conjunctivitis, PERRLA, EOMI Lungs: Clear to auscultation bilaterally, no rhonchi, wheeze or crackle Cardiovascular: Regular rate and rhythm  Lab Results  Component Value Date   WBC 4.8 09/20/2010   HGB 11.4* 09/20/2010   HCT 33.3* 09/20/2010   PLT 181.0 09/20/2010   GLUCOSE 75 09/20/2010   CHOL 138 09/20/2010   TRIG 64.0 09/20/2010   HDL 49.30 09/20/2010   LDLCALC 76 09/20/2010   ALT 25 09/20/2010   AST 29 09/20/2010   NA 139 09/20/2010   K 4.4 09/20/2010   CL 107 09/20/2010   CREATININE 1.0 09/20/2010   BUN 12 09/20/2010   CO2 28 09/20/2010   TSH 1.94 09/20/2010   HGBA1C 5.3 07/05/2007         Assessment & Plan:  Cough, dry but persisting symptoms greater than one month -  O2 sats normal and lung exam  clear  Did not take Atrovent nasal spray or prior Tussionex due to confusion about medications during February 2013 OV (reviewed in depth today prior instructions and subsequent confusion)  Suspect post viral cough with element of reactive airway disease Will check chest x-ray given chronicity of symptoms and patient personal history of bronchitis and pneumonia (patient request imaging as well) Has completed Z-Pak antibiotics, hold further antibiotics unless abnormality on imaging as patient is afebrile Treat with cough suppression: Tussionex at bedtime and Mucinex during the day If symptoms unimproved and if no evidence of infection on chest x-ray, consider prednisone taper for reactive airway disease with albuterol inhaler - reviewed same with patient in depth today - patient will call if symptoms worse

## 2011-09-22 ENCOUNTER — Other Ambulatory Visit (INDEPENDENT_AMBULATORY_CARE_PROVIDER_SITE_OTHER): Payer: 59

## 2011-09-22 ENCOUNTER — Telehealth: Payer: Self-pay | Admitting: *Deleted

## 2011-09-22 DIAGNOSIS — Z0000 Encounter for general adult medical examination without abnormal findings: Secondary | ICD-10-CM

## 2011-09-22 LAB — CBC WITH DIFFERENTIAL/PLATELET
Basophils Relative: 0.7 % (ref 0.0–3.0)
Eosinophils Absolute: 0.1 10*3/uL (ref 0.0–0.7)
Hemoglobin: 11.7 g/dL — ABNORMAL LOW (ref 12.0–15.0)
Lymphocytes Relative: 44 % (ref 12.0–46.0)
MCHC: 32.4 g/dL (ref 30.0–36.0)
Neutro Abs: 1.7 10*3/uL (ref 1.4–7.7)
RBC: 3.92 Mil/uL (ref 3.87–5.11)

## 2011-09-22 LAB — URINALYSIS, ROUTINE W REFLEX MICROSCOPIC
Hgb urine dipstick: NEGATIVE
Specific Gravity, Urine: >=1.03 (ref 1.000–1.030)
Total Protein, Urine: NEGATIVE
Urine Glucose: NEGATIVE
pH: 5.5 (ref 5.0–8.0)

## 2011-09-22 LAB — LIPID PANEL
HDL: 50.7 mg/dL (ref >39.00)
Total CHOL/HDL Ratio: 3

## 2011-09-22 LAB — BASIC METABOLIC PANEL
CO2: 28 mEq/L (ref 19–32)
Calcium: 9.4 mg/dL (ref 8.4–10.5)
Chloride: 108 mEq/L (ref 96–112)
Sodium: 141 mEq/L (ref 135–145)

## 2011-09-22 LAB — HEPATIC FUNCTION PANEL
Bilirubin, Direct: 0.1 mg/dL (ref 0.0–0.3)
Total Bilirubin: 0.8 mg/dL (ref 0.3–1.2)

## 2011-09-22 NOTE — Telephone Encounter (Signed)
Pt in lab needing cpx labs done. No order in comp. Inform anita will put orders in epic... 09/22/11@11 :54am/LMB

## 2011-10-02 ENCOUNTER — Other Ambulatory Visit: Payer: 59

## 2011-10-09 ENCOUNTER — Ambulatory Visit (INDEPENDENT_AMBULATORY_CARE_PROVIDER_SITE_OTHER): Payer: 59 | Admitting: Internal Medicine

## 2011-10-09 ENCOUNTER — Encounter: Payer: Self-pay | Admitting: Internal Medicine

## 2011-10-09 VITALS — BP 112/72 | HR 70 | Temp 98.4°F | Ht 64.0 in | Wt 158.1 lb

## 2011-10-09 DIAGNOSIS — Z0000 Encounter for general adult medical examination without abnormal findings: Secondary | ICD-10-CM

## 2011-10-09 DIAGNOSIS — E663 Overweight: Secondary | ICD-10-CM

## 2011-10-09 MED ORDER — PHENTERMINE HCL 37.5 MG PO CAPS: 37.5000 mg | ORAL_CAPSULE | ORAL | Status: DC

## 2011-10-09 NOTE — Progress Notes (Signed)
Subjective:    Patient ID: Brandy Salas, female    DOB: October 08, 1965, 46 y.o.   MRN: [id number]  HPI patient is here today for annual physical. Patient feels well and has no complaints.  Overweight - would like to resume phentermine to help to lose weight. Used x 30d 09/2010 - no advere side effects - has changed diet patterns - considering exercise plans but none started yet  Past Medical History  Diagnosis Date  . THROMBOCYTOPENIA   . Anemia    Family History  Problem Relation Age of Onset  . Diabetes Mother   . Hypertension Mother   . Hypertension Father   . Prostate cancer Other     grandparent   History  Substance Use Topics  . Smoking status: Never Smoker   . Smokeless tobacco: Not on file   Comment: Married lives with spouse, teenage son and twin dtrs. 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 Alcohol Use: No    Review of Systems Constitutional: Negative for fever or unexpected weight change.  Respiratory: Negative for cough and shortness of breath.   Cardiovascular: Negative for chest pain or palpitations.  Gastrointestinal: Negative for abdominal pain, no bowel changes.  Musculoskeletal: Negative for gait problem or joint swelling.  Skin: Negative for rash.  Neurological: Negative for dizziness or headache.  No other specific complaints in a complete review of systems (except as listed in HPI above).     Objective:   Physical Exam  BP 112/72  Pulse 70  Temp 98.4 F (36.9 C) (Oral)  Ht 5\' 4"  (1.626 m)  Wt 158 lb 1.9 oz (71.723 kg)  BMI 27.14 kg/m2  SpO2 99% Wt Readings from Last 3 Encounters:  10/09/11 158 lb 1.9 oz (71.723 kg)  05/29/11 155 lb (70.308 kg)  10/06/10 166 lb 6.4 oz (75.479 kg)   Constitutional: She appears well-developed and well-nourished. No distress.  HENT: Head: Normocephalic and atraumatic. Ears: B TMs ok, no erythema or effusion; Nose: Nose normal. Mouth/Throat: Oropharynx is clear and moist. No  oropharyngeal exudate.  Eyes: Conjunctivae and EOM are normal. Pupils are equal, round, and reactive to light. No scleral icterus.  Neck: Normal range of motion. Neck supple. No JVD present. No thyromegaly present.  Cardiovascular: Normal rate, regular rhythm and normal heart sounds.  No murmur heard. No BLE edema. Pulmonary/Chest: Effort normal and breath sounds normal. No respiratory distress. She has no wheezes.  Abdominal: Soft. Bowel sounds are normal. She exhibits no distension. There is no tenderness. no masses Musculoskeletal: Normal range of motion, no joint effusions. No gross deformities Neurological: She is alert and oriented to person, place, and time. No cranial nerve deficit. Coordination normal.  Skin: Skin is warm and dry. No rash noted. No erythema.  Psychiatric: She has a normal mood and affect. Her behavior is normal. Judgment and thought content normal.   Lab Results  Component Value Date   WBC 4.0* 09/22/2011   HGB 11.7* 09/22/2011   HCT 36.2 09/22/2011   PLT 198.0 09/22/2011   GLUCOSE 91 09/22/2011   CHOL 143 09/22/2011   TRIG 65.0 09/22/2011   HDL 50.70 09/22/2011   LDLCALC 79 09/22/2011   ALT 24 09/22/2011   AST 27 09/22/2011   NA 141 09/22/2011   K 4.4 09/22/2011   CL 108 09/22/2011   CREATININE 1.0 09/22/2011   BUN 11 09/22/2011   CO2 28 09/22/2011   TSH 1.42 09/22/2011   HGBA1C 5.3 07/05/2007  ECG: sinus @ 66, nonspecific T wave (unchanged from 10/06/10)    Assessment & Plan:  CPX/v70.0 - Patient has been counseled on age-appropriate routine health concerns for screening and prevention. These are reviewed and up-to-date. Immunizations are up-to-date or declined. Labs and ECG reviewed.

## 2011-10-09 NOTE — Patient Instructions (Addendum)
It was good to see you today. Exam, EKG and labs look great - Ok to use phentermine to help stimulate weight loss efforts - 30day supply given today (1st of up to 3 months as discussed) Will needed recheck office visit for weight, blood pressure check and symptoms review before refills will be given Also work on lifestyle changes as discussed (low fat, low carb, increased protein diet; improved exercise efforts; weight loss) to control sugar, blood pressure and cholesterol levels and/or reduce risk of developing other medical problems. Look into [email or web address][employer]com.cy or other type of food journal to assist you in this process. Please schedule followup in 12 months for physical and labs, call sooner if problems. Health Maintenance, Females A healthy lifestyle and preventative care can promote health and wellness.  Maintain regular health, dental, and eye exams.   Eat a healthy diet. Foods like vegetables, fruits, whole grains, low-fat dairy products, and lean protein foods contain the nutrients you need without too many calories. Decrease your intake of foods high in solid fats, added sugars, and salt. Get information about a proper diet from your caregiver, if necessary.   Regular physical exercise is one of the most important things you can do for your health. Most adults should get at least 150 minutes of moderate-intensity exercise (any activity that increases your heart rate and causes you to sweat) each week. In addition, most adults need muscle-strengthening exercises on 2 or more days a week.     Maintain a healthy weight. The body mass index (BMI) is a screening tool to identify possible weight problems. It provides an estimate of body fat based on height and weight. Your caregiver can help determine your BMI, and can help you achieve or maintain a healthy weight. For adults 20 years and older:   A BMI below 18.5 is considered underweight.   A BMI of 18.5 to 24.9 is normal.   A BMI of 25 to  29.9 is considered overweight.   A BMI of 30 and above is considered obese.   Maintain normal blood lipids and cholesterol by exercising and minimizing your intake of saturated fat. Eat a balanced diet with plenty of fruits and vegetables. Blood tests for lipids and cholesterol should begin at age 67 and be repeated every 5 years. If your lipid or cholesterol levels are high, you are over 50, or you are a high risk for heart disease, you may need your cholesterol levels checked more frequently. Ongoing high lipid and cholesterol levels should be treated with medicines if diet and exercise are not effective.   If you smoke, find out from your caregiver how to quit. If you do not use tobacco, do not start.   If you are pregnant, do not drink alcohol. If you are breastfeeding, be very cautious about drinking alcohol. If you are not pregnant and choose to drink alcohol, do not exceed 1 drink per day. One drink is considered to be 12 ounces (355 mL) of beer, 5 ounces (148 mL) of wine, or 1.5 ounces (44 mL) of liquor.   Avoid use of street drugs. Do not share needles with anyone. Ask for help if you need support or instructions about stopping the use of drugs.   High blood pressure causes heart disease and increases the risk of stroke. Blood pressure should be checked at least every 1 to 2 years. Ongoing high blood pressure should be treated with medicines, if weight loss and exercise are not effective.   If you  are 52 to 48 years old, ask your caregiver if you should take aspirin to prevent strokes.   Diabetes screening involves taking a blood sample to check your fasting blood sugar level. This should be done once every 3 years, after age 66, if you are within normal weight and without risk factors for diabetes. Testing should be considered at a younger age or be carried out more frequently if you are overweight and have at least 1 risk factor for diabetes.   Breast cancer screening is essential  preventative care for women. You should practice "breast self-awareness." This means understanding the normal appearance and feel of your breasts and may include breast self-examination. Any changes detected, no matter how small, should be reported to a caregiver. Women in their 34s and 30s should have a clinical breast exam (CBE) by a caregiver as part of a regular health exam every 1 to 3 years. After age 4, women should have a CBE every year. Starting at age 54, women should consider having a mammogram (breast X-ray) every year. Women who have a family history of breast cancer should talk to their caregiver about genetic screening. Women at a high risk of breast cancer should talk to their caregiver about having an MRI and a mammogram every year.   The Pap test is a screening test for cervical cancer. Women should have a Pap test starting at age 71. Between ages 56 and 33, Pap tests should be repeated every 2 years. Beginning at age 24, you should have a Pap test every 3 years as long as the past 3 Pap tests have been normal. If you had a hysterectomy for a problem that was not cancer or a condition that could lead to cancer, then you no longer need Pap tests. If you are between ages 57 and 72, and you have had normal Pap tests going back 10 years, you no longer need Pap tests. If you have had past treatment for cervical cancer or a condition that could lead to cancer, you need Pap tests and screening for cancer for at least 20 years after your treatment. If Pap tests have been discontinued, risk factors (such as a new sexual partner) need to be reassessed to determine if screening should be resumed. Some women have medical problems that increase the chance of getting cervical cancer. In these cases, your caregiver may recommend more frequent screening and Pap tests.   The human papillomavirus (HPV) test is an additional test that may be used for cervical cancer screening. The HPV test looks for the virus  that can cause the cell changes on the cervix. The cells collected during the Pap test can be tested for HPV. The HPV test could be used to screen women aged 70 years and older, and should be used in women of any age who have unclear Pap test results. After the age of 61, women should have HPV testing at the same frequency as a Pap test.   Colorectal cancer can be detected and often prevented. Most routine colorectal cancer screening begins at the age of 37 and continues through age 42. However, your caregiver may recommend screening at an earlier age if you have risk factors for colon cancer. On a yearly basis, your caregiver may provide home test kits to check for hidden blood in the stool. Use of a small camera at the end of a tube, to directly examine the colon (sigmoidoscopy or colonoscopy), can detect the earliest forms of colorectal cancer.  Talk to your caregiver about this at age 77, when routine screening begins. Direct examination of the colon should be repeated every 5 to 10 years through age 96, unless early forms of pre-cancerous polyps or small growths are found.   Hepatitis C blood testing is recommended for all people born from [birth year] through 1967 and any individual with known risks for hepatitis C.   Practice safe sex. Use condoms and avoid high-risk sexual practices to reduce the spread of sexually transmitted infections (STIs). Sexually active women aged 72 and younger should be checked for Chlamydia, which is a common sexually transmitted infection. Older women with new or multiple partners should also be tested for Chlamydia. Testing for other STIs is recommended if you are sexually active and at increased risk.   Osteoporosis is a disease in which the bones lose minerals and strength with aging. This can result in serious bone fractures. The risk of osteoporosis can be identified using a bone density scan. Women ages 8 and over and women at risk for fractures or osteoporosis should  discuss screening with their caregivers. Ask your caregiver whether you should be taking a calcium supplement or vitamin D to reduce the rate of osteoporosis.   Menopause can be associated with physical symptoms and risks. Hormone replacement therapy is available to decrease symptoms and risks. You should talk to your caregiver about whether hormone replacement therapy is right for you.   Use sunscreen with a sun protection factor (SPF) of 30 or greater. Apply sunscreen liberally and repeatedly throughout the day. You should seek shade when your shadow is shorter than you. Protect yourself by wearing long sleeves, pants, a wide-brimmed hat, and sunglasses year round, whenever you are outdoors.   Notify your caregiver of new moles or changes in moles, especially if there is a change in shape or color. Also notify your caregiver if a mole is larger than the size of a pencil eraser.   Stay current with your immunizations.  Document Released: 09/12/2010 Document Revised: 02/16/2011 Document Reviewed: 09/12/2010 [facility] Patient Information 2012 [place], [place].

## 2011-10-09 NOTE — Assessment & Plan Note (Signed)
Pt requests phentermine to help "kick start" new plan to lose 20# -  reviewed risk/benefit discussed - max 3 consecutive months and need for recheck of weight and BP, symptoms review on meds - pt understands and agrees to same - 1st of 3 rx done today  Wt Readings from Last 3 Encounters:  10/09/11 158 lb 1.9 oz (71.723 kg)  05/29/11 155 lb (70.308 kg)  10/06/10 166 lb 6.4 oz (75.479 kg)

## 2012-06-05 ENCOUNTER — Other Ambulatory Visit: Payer: Self-pay | Admitting: Internal Medicine

## 2012-06-06 NOTE — Telephone Encounter (Signed)
Notified pt rx ready for pick-up.../lmb 

## 2012-12-10 ENCOUNTER — Other Ambulatory Visit (INDEPENDENT_AMBULATORY_CARE_PROVIDER_SITE_OTHER): Payer: 59

## 2012-12-10 ENCOUNTER — Telehealth: Payer: Self-pay | Admitting: *Deleted

## 2012-12-10 DIAGNOSIS — Z0000 Encounter for general adult medical examination without abnormal findings: Secondary | ICD-10-CM

## 2012-12-10 LAB — HEPATIC FUNCTION PANEL
ALT: 21 U/L (ref 0–35)
Albumin: 3.9 g/dL (ref 3.5–5.2)
Alkaline Phosphatase: 32 U/L — ABNORMAL LOW (ref 39–117)
Bilirubin, Direct: 0.1 mg/dL (ref 0.0–0.3)
Total Bilirubin: 0.6 mg/dL (ref 0.3–1.2)

## 2012-12-10 LAB — BASIC METABOLIC PANEL
CO2: 26 mEq/L (ref 19–32)
Calcium: 9.2 mg/dL (ref 8.4–10.5)
Chloride: 106 mEq/L (ref 96–112)
GFR: 79.25 mL/min (ref >60.00)
Glucose, Bld: 83 mg/dL (ref 70–99)
Potassium: 4.2 mEq/L (ref 3.5–5.1)
Sodium: 136 mEq/L (ref 135–145)

## 2012-12-10 LAB — CBC WITH DIFFERENTIAL/PLATELET
Basophils Absolute: 0 10*3/uL (ref 0.0–0.1)
Basophils Relative: 0.5 % (ref 0.0–3.0)
Eosinophils Absolute: 0.1 10*3/uL (ref 0.0–0.7)
Eosinophils Relative: 1.5 % (ref 0.0–5.0)
HCT: 33.2 % — ABNORMAL LOW (ref 36.0–46.0)
Hemoglobin: 11.3 g/dL — ABNORMAL LOW (ref 12.0–15.0)
Lymphocytes Relative: 27.4 % (ref 12.0–46.0)
Lymphs Abs: 1.2 10*3/uL (ref 0.7–4.0)
MCHC: 34 g/dL (ref 30.0–36.0)
MCV: 92 fl (ref 78.0–100.0)
Neutro Abs: 2.7 10*3/uL (ref 1.4–7.7)
RBC: 3.61 Mil/uL — ABNORMAL LOW (ref 3.87–5.11)
RDW: 13.2 % (ref 11.5–14.6)

## 2012-12-10 LAB — URINALYSIS, ROUTINE W REFLEX MICROSCOPIC
Hgb urine dipstick: NEGATIVE
Ketones, ur: NEGATIVE
Renal Epithel, UA: NONE SEEN
Specific Gravity, Urine: 1.025 (ref 1.000–1.030)
Total Protein, Urine: NEGATIVE
Urine Glucose: NEGATIVE
pH: 6 (ref 5.0–8.0)

## 2012-12-10 LAB — LIPID PANEL
Cholesterol: 135 mg/dL (ref 0–200)
HDL: 38.4 mg/dL — ABNORMAL LOW (ref >39.00)
LDL Cholesterol: 72 mg/dL (ref 0–99)
Total CHOL/HDL Ratio: 4
Triglycerides: 122 mg/dL (ref 0.0–149.0)

## 2012-12-10 NOTE — Telephone Encounter (Signed)
Thanks for the message and handling this situation Thanks!

## 2012-12-10 NOTE — Telephone Encounter (Signed)
Pt is in the lab was told to come do cpx labs prior to appt 12/16/12. Inform shakira labs are done after pt see md. Pt is very upset she states was told by schedulers to come prior and demanding labs to be done. Pt has [facility] inform shakira was enter cpx this time only. Inform pt next time will have to have appt first no exeptions....Raechel Chute

## 2012-12-16 ENCOUNTER — Encounter: Payer: Self-pay | Admitting: Internal Medicine

## 2012-12-16 ENCOUNTER — Ambulatory Visit (INDEPENDENT_AMBULATORY_CARE_PROVIDER_SITE_OTHER): Payer: 59 | Admitting: Internal Medicine

## 2012-12-16 VITALS — BP 130/72 | HR 68 | Temp 97.5°F | Ht 64.0 in | Wt 170.0 lb

## 2012-12-16 DIAGNOSIS — Z0000 Encounter for general adult medical examination without abnormal findings: Secondary | ICD-10-CM

## 2012-12-16 DIAGNOSIS — E663 Overweight: Secondary | ICD-10-CM

## 2012-12-16 MED ORDER — LORCASERIN HCL 10 MG PO TABS: 10.0000 mg | ORAL_TABLET | Freq: Two times a day (BID) | ORAL | Status: DC

## 2012-12-16 NOTE — Assessment & Plan Note (Signed)
Pt requests prescription to assist with weight loss efforts. Prior prescription for phentermine caused nausea during last trial March 2014.  Discussed potential med options and patient selects Belviq - we reviewed potential risk/benefit and possible side effects - pt understands and agrees to same  need for recheck of weight after 12 weeks on med pt understands and agrees to same -  Also The patient is asked to make an attempt to improve diet and exercise patterns to aid in medical management of this problem.   Wt Readings from Last 3 Encounters:  12/16/12 170 lb (77.111 kg)  10/09/11 158 lb 1.9 oz (71.723 kg)  05/29/11 155 lb (70.308 kg)

## 2012-12-16 NOTE — Progress Notes (Signed)
Subjective:    Patient ID: Brandy Salas, female    DOB: 1966/02/13, 47 y.o.   MRN: [id number]  HPI  patient is here today for annual physical. Patient feels well and has no complaints.  Overweight - previously on phentermine to help to lose weight. Used x 30d 09/2010, 09/2011 and 05/2012 - no adverse side effects - has changed diet patterns - considering exercise plans but none started yet  Past Medical History  Diagnosis Date  . THROMBOCYTOPENIA   . Anemia    Family History  Problem Relation Age of Onset  . Diabetes Mother   . Hypertension Mother   . Hypertension Father   . Prostate cancer Other     grandparent   History  Substance Use Topics  . Smoking status: Never Smoker   . Smokeless tobacco: Not on file     Comment: Married lives with spouse, teenage son and twin dtrs. 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 Alcohol Use: No    Review of Systems  Constitutional: Negative for fever or unexpected weight change.  Respiratory: Negative for cough and shortness of breath.   Cardiovascular: Negative for chest pain or palpitations.  Gastrointestinal: Negative for abdominal pain, no bowel changes.  Musculoskeletal: Negative for gait problem or joint swelling.  Skin: Negative for rash.  Neurological: Negative for dizziness or headache.  No other specific complaints in a complete review of systems (except as listed in HPI above).     Objective:   Physical Exam  BP 130/72  Pulse 68  Temp(Src) 97.5 F (36.4 C) (Oral)  Ht 5\' 4"  (1.626 m)  Wt 170 lb (77.111 kg)  BMI 29.17 kg/m2  SpO2 95% Wt Readings from Last 3 Encounters:  12/16/12 170 lb (77.111 kg)  10/09/11 158 lb 1.9 oz (71.723 kg)  05/29/11 155 lb (70.308 kg)   Constitutional: She appears overweight, but well-developed and well-nourished. No distress.  HENT: Head: Normocephalic and atraumatic. Ears: B TMs ok, no erythema or effusion; Nose: Nose normal. Mouth/Throat: Oropharynx is  clear and moist. No oropharyngeal exudate.  Eyes: Conjunctivae and EOM are normal. Pupils are equal, round, and reactive to light. No scleral icterus.  Neck: Normal range of motion. Neck supple. No JVD present. No thyromegaly present.  Cardiovascular: Normal rate, regular rhythm and normal heart sounds.  No murmur heard. No BLE edema. Pulmonary/Chest: Effort normal and breath sounds normal. No respiratory distress. She has no wheezes.  Abdominal: Soft. Bowel sounds are normal. She exhibits no distension. There is no tenderness. no masses Musculoskeletal: Normal range of motion, no joint effusions. No gross deformities Neurological: She is alert and oriented to person, place, and time. No cranial nerve deficit. Coordination normal.  Skin: Skin is warm and dry. No rash noted. No erythema.  Psychiatric: She has a normal mood and affect. Her behavior is normal. Judgment and thought content normal.   Lab Results  Component Value Date   WBC 4.5 12/10/2012   HGB 11.3* 12/10/2012   HCT 33.2* 12/10/2012   PLT 200.0 12/10/2012   GLUCOSE 83 12/10/2012   CHOL 135 12/10/2012   TRIG 122.0 12/10/2012   HDL 38.40* 12/10/2012   LDLCALC 72 12/10/2012   ALT 21 12/10/2012   AST 26 12/10/2012   NA 136 12/10/2012   K 4.2 12/10/2012   CL 106 12/10/2012   CREATININE 1.0 12/10/2012   BUN 8 12/10/2012   CO2 26 12/10/2012   TSH 1.55 12/10/2012  HGBA1C 5.3 07/05/2007       Assessment & Plan:  CPX/v70.0 - Patient has been counseled on age-appropriate routine health concerns for screening and prevention. These are reviewed and up-to-date. Immunizations are up-to-date or declined. Labs and prior ECG reviewed.

## 2012-12-16 NOTE — Patient Instructions (Addendum)
It was good to see you today. Health Maintenance reviewed - all recommended immunizations and age-appropriate screenings are up-to-date. We have reviewed your prior records including labs and tests today Medications reviewed and updated: will start Belviq for medication to assist you with your weight loss goals Your prescription(s) have been submitted to your pharmacy. Please take as directed and contact our office if you believe you are having problem(s) with the medication(s). followup in 12 weeks to recheck weight and consider continuation of medication versus alternate treatment; please call sooner if side effects or other problems  Health Maintenance, Females A healthy lifestyle and preventative care can promote health and wellness.  Maintain regular health, dental, and eye exams.  Eat a healthy diet. Foods like vegetables, fruits, whole grains, low-fat dairy products, and lean protein foods contain the nutrients you need without too many calories. Decrease your intake of foods high in solid fats, added sugars, and salt. Get information about a proper diet from your caregiver, if necessary.  Regular physical exercise is one of the most important things you can do for your health. Most adults should get at least 150 minutes of moderate-intensity exercise (any activity that increases your heart rate and causes you to sweat) each week. In addition, most adults need muscle-strengthening exercises on 2 or more days a week.   Maintain a healthy weight. The body mass index (BMI) is a screening tool to identify possible weight problems. It provides an estimate of body fat based on height and weight. Your caregiver can help determine your BMI, and can help you achieve or maintain a healthy weight. For adults 20 years and older:  A BMI below 18.5 is considered underweight.  A BMI of 18.5 to 24.9 is normal.  A BMI of 25 to 29.9 is considered overweight.  A BMI of 30 and above is considered  obese.  Maintain normal blood lipids and cholesterol by exercising and minimizing your intake of saturated fat. Eat a balanced diet with plenty of fruits and vegetables. Blood tests for lipids and cholesterol should begin at age 66 and be repeated every 5 years. If your lipid or cholesterol levels are high, you are over 50, or you are a high risk for heart disease, you may need your cholesterol levels checked more frequently.Ongoing high lipid and cholesterol levels should be treated with medicines if diet and exercise are not effective.  If you smoke, find out from your caregiver how to quit. If you do not use tobacco, do not start.  If you are pregnant, do not drink alcohol. If you are breastfeeding, be very cautious about drinking alcohol. If you are not pregnant and choose to drink alcohol, do not exceed 1 drink per day. One drink is considered to be 12 ounces (355 mL) of beer, 5 ounces (148 mL) of wine, or 1.5 ounces (44 mL) of liquor.  Avoid use of street drugs. Do not share needles with anyone. Ask for help if you need support or instructions about stopping the use of drugs.  High blood pressure causes heart disease and increases the risk of stroke. Blood pressure should be checked at least every 1 to 2 years. Ongoing high blood pressure should be treated with medicines, if weight loss and exercise are not effective.  If you are 70 to 49 years old, ask your caregiver if you should take aspirin to prevent strokes.  Diabetes screening involves taking a blood sample to check your fasting blood sugar level. This should be done  once every 3 years, after age 50, if you are within normal weight and without risk factors for diabetes. Testing should be considered at a younger age or be carried out more frequently if you are overweight and have at least 1 risk factor for diabetes.  Breast cancer screening is essential preventative care for women. You should practice "breast self-awareness." This means  understanding the normal appearance and feel of your breasts and may include breast self-examination. Any changes detected, no matter how small, should be reported to a caregiver. Women in their 45s and 30s should have a clinical breast exam (CBE) by a caregiver as part of a regular health exam every 1 to 3 years. After age 46, women should have a CBE every year. Starting at age 45, women should consider having a mammogram (breast X-ray) every year. Women who have a family history of breast cancer should talk to their caregiver about genetic screening. Women at a high risk of breast cancer should talk to their caregiver about having an MRI and a mammogram every year.  The Pap test is a screening test for cervical cancer. Women should have a Pap test starting at age 37. Between ages 83 and 31, Pap tests should be repeated every 2 years. Beginning at age 45, you should have a Pap test every 3 years as long as the past 3 Pap tests have been normal. If you had a hysterectomy for a problem that was not cancer or a condition that could lead to cancer, then you no longer need Pap tests. If you are between ages 75 and 71, and you have had normal Pap tests going back 10 years, you no longer need Pap tests. If you have had past treatment for cervical cancer or a condition that could lead to cancer, you need Pap tests and screening for cancer for at least 20 years after your treatment. If Pap tests have been discontinued, risk factors (such as a new sexual partner) need to be reassessed to determine if screening should be resumed. Some women have medical problems that increase the chance of getting cervical cancer. In these cases, your caregiver may recommend more frequent screening and Pap tests.  The human papillomavirus (HPV) test is an additional test that may be used for cervical cancer screening. The HPV test looks for the virus that can cause the cell changes on the cervix. The cells collected during the Pap test  can be tested for HPV. The HPV test could be used to screen women aged 58 years and older, and should be used in women of any age who have unclear Pap test results. After the age of 62, women should have HPV testing at the same frequency as a Pap test.  Colorectal cancer can be detected and often prevented. Most routine colorectal cancer screening begins at the age of 82 and continues through age 10. However, your caregiver may recommend screening at an earlier age if you have risk factors for colon cancer. On a yearly basis, your caregiver may provide home test kits to check for hidden blood in the stool. Use of a small camera at the end of a tube, to directly examine the colon (sigmoidoscopy or colonoscopy), can detect the earliest forms of colorectal cancer. Talk to your caregiver about this at age 3, when routine screening begins. Direct examination of the colon should be repeated every 5 to 10 years through age 31, unless early forms of pre-cancerous polyps or small growths are found.  Hepatitis C blood testing is recommended for all people born from [birth year] through 1967 and any individual with known risks for hepatitis C.  Practice safe sex. Use condoms and avoid high-risk sexual practices to reduce the spread of sexually transmitted infections (STIs). Sexually active women aged 74 and younger should be checked for Chlamydia, which is a common sexually transmitted infection. Older women with new or multiple partners should also be tested for Chlamydia. Testing for other STIs is recommended if you are sexually active and at increased risk.  Osteoporosis is a disease in which the bones lose minerals and strength with aging. This can result in serious bone fractures. The risk of osteoporosis can be identified using a bone density scan. Women ages 41 and over and women at risk for fractures or osteoporosis should discuss screening with their caregivers. Ask your caregiver whether you should be taking a  calcium supplement or vitamin D to reduce the rate of osteoporosis.  Menopause can be associated with physical symptoms and risks. Hormone replacement therapy is available to decrease symptoms and risks. You should talk to your caregiver about whether hormone replacement therapy is right for you.  Use sunscreen with a sun protection factor (SPF) of 30 or greater. Apply sunscreen liberally and repeatedly throughout the day. You should seek shade when your shadow is shorter than you. Protect yourself by wearing long sleeves, pants, a wide-brimmed hat, and sunglasses year round, whenever you are outdoors.  Notify your caregiver of new moles or changes in moles, especially if there is a change in shape or color. Also notify your caregiver if a mole is larger than the size of a pencil eraser.  Stay current with your immunizations. Document Released: 09/12/2010 Document Revised: 05/22/2011 Document Reviewed: 09/12/2010 [facility] Patient Information 2014 [place], [place]. Exercise to Lose Weight Exercise and a healthy diet may help you lose weight. Your doctor may suggest specific exercises. EXERCISE IDEAS AND TIPS  Choose low-cost things you enjoy doing, such as walking, bicycling, or exercising to workout videos.  Take stairs instead of the elevator.  Walk during your lunch break.  Park your car further away from work or school.  Go to a gym or an exercise class.  Start with 5 to 10 minutes of exercise each day. Build up to 30 minutes of exercise 4 to 6 days a week.  Wear shoes with good support and comfortable clothes.  Stretch before and after working out.  Work out until you breathe harder and your heart beats faster.  Drink extra water when you exercise.  Do not do so much that you hurt yourself, feel dizzy, or get very short of breath. Exercises that burn about 150 calories:  Running 1  miles in 15 minutes.  Playing volleyball for 45 to 60 minutes.  Washing and waxing a car  for 45 to 60 minutes.  Playing touch football for 45 minutes.  Walking 1  miles in 35 minutes.  Pushing a stroller 1  miles in 30 minutes.  Playing basketball for 30 minutes.  Raking leaves for 30 minutes.  Bicycling 5 miles in 30 minutes.  Walking 2 miles in 30 minutes.  Dancing for 30 minutes.  Shoveling snow for 15 minutes.  Swimming laps for 20 minutes.  Walking up stairs for 15 minutes.  Bicycling 4 miles in 15 minutes.  Gardening for 30 to 45 minutes.  Jumping rope for 15 minutes.  Washing windows or floors for 45 to 60 minutes. Document Released: 04/01/2010 Document Revised:  05/22/2011 Document Reviewed: 04/01/2010 [employer] Patient Information 2014 [place], [place].

## 2013-04-14 LAB — HM PAP SMEAR

## 2013-04-24 ENCOUNTER — Encounter: Payer: Self-pay | Admitting: Internal Medicine

## 2013-04-24 ENCOUNTER — Ambulatory Visit (INDEPENDENT_AMBULATORY_CARE_PROVIDER_SITE_OTHER): Payer: 59 | Admitting: Internal Medicine

## 2013-04-24 VITALS — BP 130/80 | HR 84 | Temp 98.3°F | Wt 165.0 lb

## 2013-04-24 DIAGNOSIS — J209 Acute bronchitis, unspecified: Secondary | ICD-10-CM

## 2013-04-24 MED ORDER — HYDROCODONE-HOMATROPINE 5-1.5 MG/5ML PO SYRP: 5.0000 mL | ORAL_SOLUTION | Freq: Four times a day (QID) | ORAL | Status: DC | PRN

## 2013-04-24 MED ORDER — AZITHROMYCIN 250 MG PO TABS: ORAL_TABLET | ORAL | Status: DC

## 2013-04-24 NOTE — Assessment & Plan Note (Signed)
Mild to mod, for antibx course,  to f/u any worsening symptoms or concerns 

## 2013-04-24 NOTE — Progress Notes (Signed)
   Subjective:    Patient ID: Brandy Salas, female    DOB: [birth year], 48 y.o.   MRN: [id number]  HPI Here with acute onset mild to mod 2-3 days ST, HA, general weakness and malaise, with prod cough greenish sputum, but Pt denies chest pain, increased sob or doe, wheezing, orthopnea, PND, increased LE swelling, palpitations, dizziness or syncope. Past Medical History  Diagnosis Date  . THROMBOCYTOPENIA   . Anemia    Past Surgical History  Procedure Laterality Date  . No past surgeries      reports that she has never smoked. She does not have any smokeless tobacco history on file. She reports that she does not drink alcohol or use illicit drugs. family history includes Diabetes in her mother; Hypertension in her father and mother; Prostate cancer in her other. No Known Allergies Current Outpatient Prescriptions on File Prior to Visit  Medication Sig Dispense Refill  . Lorcaserin HCl (BELVIQ) 10 MG TABS Take 10 mg by mouth 2 (two) times daily.  60 tablet  3  . Multiple Vitamins-Minerals (WOMENS ONE DAILY) TABS Take by mouth daily.        . [DISCONTINUED] ipratropium (ATROVENT) 0.03 % nasal spray Place 2 sprays into the nose 3 (three) times daily.  30 mL  0   No current facility-administered medications on file prior to visit.   Review of Systems All otherwise neg per pt     Objective:   Physical Exam BP 130/80  Pulse 84  Temp(Src) 98.3 F (36.8 C) (Oral)  Wt 165 lb (74.844 kg)  SpO2 98% VS noted, mild ill Constitutional: Pt appears well-developed and well-nourished.  HENT: Head: NCAT.  Right Ear: External ear normal.  Left Ear: External ear normal.  Bilat tm's with mild erythema.  Max sinus areas mild tender.  Pharynx with mild erythema, no exudate Eyes: Conjunctivae and EOM are normal. Pupils are equal, round, and reactive to light.  Neck: Normal range of motion. Neck supple.  Cardiovascular: Normal rate and regular rhythm.   Pulmonary/Chest: Effort normal and  breath sounds normal.  Neurological: Pt is alert. Not confused  Skin: Skin is warm. No erythema.  Psychiatric: Pt behavior is normal. Thought content normal.         Assessment & Plan:

## 2013-04-24 NOTE — Patient Instructions (Signed)
Please take all new medication as prescribed  Please continue all other medications as before, and refills have been done if requested.  Please have the pharmacy call with any other refills you may need.   

## 2013-04-24 NOTE — Progress Notes (Signed)
Pre-visit discussion using our clinic review tool. No additional management support is needed unless otherwise documented below in the visit note.  

## 2013-12-31 ENCOUNTER — Encounter: Payer: 59 | Admitting: Internal Medicine

## 2014-01-12 ENCOUNTER — Other Ambulatory Visit (INDEPENDENT_AMBULATORY_CARE_PROVIDER_SITE_OTHER): Payer: 59

## 2014-01-12 ENCOUNTER — Ambulatory Visit (INDEPENDENT_AMBULATORY_CARE_PROVIDER_SITE_OTHER): Payer: 59 | Admitting: Internal Medicine

## 2014-01-12 ENCOUNTER — Encounter: Payer: Self-pay | Admitting: Internal Medicine

## 2014-01-12 VITALS — BP 144/88 | HR 66 | Temp 98.3°F | Ht 64.0 in | Wt 171.5 lb

## 2014-01-12 DIAGNOSIS — E663 Overweight: Secondary | ICD-10-CM

## 2014-01-12 DIAGNOSIS — Z0000 Encounter for general adult medical examination without abnormal findings: Secondary | ICD-10-CM

## 2014-01-12 DIAGNOSIS — IMO0001 Reserved for inherently not codable concepts without codable children: Secondary | ICD-10-CM

## 2014-01-12 DIAGNOSIS — R030 Elevated blood-pressure reading, without diagnosis of hypertension: Secondary | ICD-10-CM

## 2014-01-12 LAB — CBC WITH DIFFERENTIAL/PLATELET
BASOS ABS: 0 10*3/uL (ref 0.0–0.1)
Basophils Relative: 0.7 % (ref 0.0–3.0)
EOS ABS: 0.2 10*3/uL (ref 0.0–0.7)
EOS PCT: 4 % (ref 0.0–5.0)
HEMATOCRIT: 37.3 % (ref 36.0–46.0)
Hemoglobin: 12.1 g/dL (ref 12.0–15.0)
LYMPHS ABS: 1.9 10*3/uL (ref 0.7–4.0)
Lymphocytes Relative: 42.3 % (ref 12.0–46.0)
MCHC: 32.5 g/dL (ref 30.0–36.0)
MCV: 91.4 fl (ref 78.0–100.0)
MONO ABS: 0.4 10*3/uL (ref 0.1–1.0)
Monocytes Relative: 9.6 % (ref 3.0–12.0)
Neutro Abs: 1.9 10*3/uL (ref 1.4–7.7)
Neutrophils Relative %: 43.4 % (ref 43.0–77.0)
Platelets: 229 10*3/uL (ref 150.0–400.0)
RBC: 4.08 Mil/uL (ref 3.87–5.11)
RDW: 13.2 % (ref 11.5–15.5)
WBC: 4.4 10*3/uL (ref 4.0–10.5)

## 2014-01-12 LAB — HEPATIC FUNCTION PANEL
ALT: 24 U/L (ref 0–35)
AST: 28 U/L (ref 0–37)
Albumin: 3.7 g/dL (ref 3.5–5.2)
Alkaline Phosphatase: 40 U/L (ref 39–117)
BILIRUBIN DIRECT: 0.1 mg/dL (ref 0.0–0.3)
Total Bilirubin: 0.6 mg/dL (ref 0.2–1.2)
Total Protein: 7.9 g/dL (ref 6.0–8.3)

## 2014-01-12 LAB — URINALYSIS, ROUTINE W REFLEX MICROSCOPIC
Bilirubin Urine: NEGATIVE
HGB URINE DIPSTICK: NEGATIVE
KETONES UR: NEGATIVE
Leukocytes, UA: NEGATIVE
Nitrite: NEGATIVE
Specific Gravity, Urine: 1.02 (ref 1.000–1.030)
TOTAL PROTEIN, URINE-UPE24: NEGATIVE
URINE GLUCOSE: NEGATIVE
UROBILINOGEN UA: 0.2 (ref 0.0–1.0)
pH: 6.5 (ref 5.0–8.0)

## 2014-01-12 LAB — BASIC METABOLIC PANEL
BUN: 11 mg/dL (ref 6–23)
CHLORIDE: 104 meq/L (ref 96–112)
CO2: 27 mEq/L (ref 19–32)
CREATININE: 1.1 mg/dL (ref 0.4–1.2)
Calcium: 9.8 mg/dL (ref 8.4–10.5)
GFR: 66.04 mL/min (ref >60.00)
GLUCOSE: 84 mg/dL (ref 70–99)
POTASSIUM: 4.4 meq/L (ref 3.5–5.1)
Sodium: 139 mEq/L (ref 135–145)

## 2014-01-12 LAB — LIPID PANEL
CHOL/HDL RATIO: 3
CHOLESTEROL: 142 mg/dL (ref 0–200)
HDL: 45.8 mg/dL (ref >39.00)
LDL CALC: 84 mg/dL (ref 0–99)
NonHDL: 96.2
Triglycerides: 63 mg/dL (ref 0.0–149.0)
VLDL: 12.6 mg/dL (ref 0.0–40.0)

## 2014-01-12 NOTE — Patient Instructions (Addendum)
It was good to see you today.  We have reviewed your prior records including labs and tests today  Health Maintenance reviewed - all recommended immunizations and age-appropriate screenings are up-to-date.  Test(s) ordered today. Your results will be released to [place] (or called to you) after review, usually within 72hours after test completion. If any changes need to be made, you will be notified at that same time.  Medications reviewed and updated, no changes recommended at this time.  Work on lifestyle changes as discussed (low fat, low carb, increased protein diet; improved exercise efforts; weight loss) to control sugar, blood pressure and cholesterol levels and/or reduce risk of developing other medical problems. Look into http://vang.com/ or other type of food journal to assist you in this process.  Schedule nurse visit for recheck blood pressure in 6-12 weeks. If remains elevated, will need to start medication for hypertension  Please schedule followup for annual exam/labs in 12 months , call sooner if problems.   Health Maintenance Adopting a healthy lifestyle and getting preventive care can go a long way to promote health and wellness. Talk with your health care provider about what schedule of regular examinations is right for you. This is a good chance for you to check in with your provider about disease prevention and staying healthy. In between checkups, there are plenty of things you can do on your own. Experts have done a lot of research about which lifestyle changes and preventive measures are most likely to keep you healthy. Ask your health care provider for more information. WEIGHT AND DIET  Eat a healthy diet  Be sure to include plenty of vegetables, fruits, low-fat dairy products, and lean protein.  Do not eat a lot of foods high in solid fats, added sugars, or salt.  Get regular exercise. This is one of the most important things you can do for your health.  Most  adults should exercise for at least 150 minutes each week. The exercise should increase your heart rate and make you sweat (moderate-intensity exercise).  Most adults should also do strengthening exercises at least twice a week. This is in addition to the moderate-intensity exercise.  Maintain a healthy weight  Body mass index (BMI) is a measurement that can be used to identify possible weight problems. It estimates body fat based on height and weight. Your health care provider can help determine your BMI and help you achieve or maintain a healthy weight.  For females 61 years of age and older:   A BMI below 18.5 is considered underweight.  A BMI of 18.5 to 24.9 is normal.  A BMI of 25 to 29.9 is considered overweight.  A BMI of 30 and above is considered obese.  Watch levels of cholesterol and blood lipids  You should start having your blood tested for lipids and cholesterol at 50 years of age, then have this test every 5 years.  You may need to have your cholesterol levels checked more often if:  Your lipid or cholesterol levels are high.  You are older than 50 years of age.  You are at high risk for heart disease.  CANCER SCREENING   Lung Cancer  Lung cancer screening is recommended for adults 82-74 years old who are at high risk for lung cancer because of a history of smoking.  A yearly low-dose CT scan of the lungs is recommended for people who:  Currently smoke.  Have quit within the past 15 years.  Have at least a  30-pack-year history of smoking. A pack year is smoking an average of one pack of cigarettes a day for 1 year.  Yearly screening should continue until it has been 15 years since you quit.  Yearly screening should stop if you develop a health problem that would prevent you from having lung cancer treatment.  Breast Cancer  Practice breast self-awareness. This means understanding how your breasts normally appear and feel.  It also means doing  regular breast self-exams. Let your health care provider know about any changes, no matter how small.  If you are in your 20s or 30s, you should have a clinical breast exam (CBE) by a health care provider every 1-3 years as part of a regular health exam.  If you are 62 or older, have a CBE every year. Also consider having a breast X-ray (mammogram) every year.  If you have a family history of breast cancer, talk to your health care provider about genetic screening.  If you are at high risk for breast cancer, talk to your health care provider about having an MRI and a mammogram every year.  Breast cancer gene (BRCA) assessment is recommended for women who have family members with BRCA-related cancers. BRCA-related cancers include:  Breast.  Ovarian.  Tubal.  Peritoneal cancers.  Results of the assessment will determine the need for genetic counseling and BRCA1 and BRCA2 testing. Cervical Cancer Routine pelvic examinations to screen for cervical cancer are no longer recommended for nonpregnant women who are considered low risk for cancer of the pelvic organs (ovaries, uterus, and vagina) and who do not have symptoms. A pelvic examination may be necessary if you have symptoms including those associated with pelvic infections. Ask your health care provider if a screening pelvic exam is right for you.   The Pap test is the screening test for cervical cancer for women who are considered at risk.  If you had a hysterectomy for a problem that was not cancer or a condition that could lead to cancer, then you no longer need Pap tests.  If you are older than 65 years, and you have had normal Pap tests for the past 10 years, you no longer need to have Pap tests.  If you have had past treatment for cervical cancer or a condition that could lead to cancer, you need Pap tests and screening for cancer for at least 20 years after your treatment.  If you no longer get a Pap test, assess your risk  factors if they change (such as having a new sexual partner). This can affect whether you should start being screened again.  Some women have medical problems that increase their chance of getting cervical cancer. If this is the case for you, your health care provider may recommend more frequent screening and Pap tests.  The human papillomavirus (HPV) test is another test that may be used for cervical cancer screening. The HPV test looks for the virus that can cause cell changes in the cervix. The cells collected during the Pap test can be tested for HPV.  The HPV test can be used to screen women 102 years of age and older. Getting tested for HPV can extend the interval between normal Pap tests from three to five years.  An HPV test also should be used to screen women of any age who have unclear Pap test results.  After 50 years of age, women should have HPV testing as often as Pap tests.  Colorectal Cancer  This type  of cancer can be detected and often prevented.  Routine colorectal cancer screening usually begins at 50 years of age and continues through 50 years of age.  Your health care provider may recommend screening at an earlier age if you have risk factors for colon cancer.  Your health care provider may also recommend using home test kits to check for hidden blood in the stool.  A small camera at the end of a tube can be used to examine your colon directly (sigmoidoscopy or colonoscopy). This is done to check for the earliest forms of colorectal cancer.  Routine screening usually begins at age 50.  Direct examination of the colon should be repeated every 5-10 years through 50 years of age. However, you may need to be screened more often if early forms of precancerous polyps or small growths are found. Skin Cancer  Check your skin from head to toe regularly.  Tell your health care provider about any new moles or changes in moles, especially if there is a change in a mole's shape  or color.  Also tell your health care provider if you have a mole that is larger than the size of a pencil eraser.  Always use sunscreen. Apply sunscreen liberally and repeatedly throughout the day.  Protect yourself by wearing long sleeves, pants, a wide-brimmed hat, and sunglasses whenever you are outside. HEART DISEASE, DIABETES, AND HIGH BLOOD PRESSURE   Have your blood pressure checked at least every 1-2 years. High blood pressure causes heart disease and increases the risk of stroke.  If you are between 27 years and 9 years old, ask your health care provider if you should take aspirin to prevent strokes.  Have regular diabetes screenings. This involves taking a blood sample to check your fasting blood sugar level.  If you are at a normal weight and have a low risk for diabetes, have this test once every three years after 50 years of age.  If you are overweight and have a high risk for diabetes, consider being tested at a younger age or more often. PREVENTING INFECTION  Hepatitis B  If you have a higher risk for hepatitis B, you should be screened for this virus. You are considered at high risk for hepatitis B if:  You were born in a country where hepatitis B is common. Ask your health care provider which countries are considered high risk.  Your parents were born in a high-risk country, and you have not been immunized against hepatitis B (hepatitis B vaccine).  You have HIV or AIDS.  You use needles to inject street drugs.  You live with someone who has hepatitis B.  You have had sex with someone who has hepatitis B.  You get hemodialysis treatment.  You take certain medicines for conditions, including cancer, organ transplantation, and autoimmune conditions. Hepatitis C  Blood testing is recommended for:  Everyone born from [birth year] through 1967.  Anyone with known risk factors for hepatitis C. Sexually transmitted infections (STIs)  You should be screened for  sexually transmitted infections (STIs) including gonorrhea and chlamydia if:  You are sexually active and are younger than 50 years of age.  You are older than 50 years of age and your health care provider tells you that you are at risk for this type of infection.  Your sexual activity has changed since you were last screened and you are at an increased risk for chlamydia or gonorrhea. Ask your health care provider if you are at risk.  If you do not have HIV, but are at risk, it may be recommended that you take a prescription medicine daily to prevent HIV infection. This is called pre-exposure prophylaxis (PrEP). You are considered at risk if:  You are sexually active and do not regularly use condoms or know the HIV status of your partner(s).  You take drugs by injection.  You are sexually active with a partner who has HIV. Talk with your health care provider about whether you are at high risk of being infected with HIV. If you choose to begin PrEP, you should first be tested for HIV. You should then be tested every 3 months for as long as you are taking PrEP.  PREGNANCY   If you are premenopausal and you may become pregnant, ask your health care provider about preconception counseling.  If you may become pregnant, take 400 to 800 micrograms (mcg) of folic acid every day.  If you want to prevent pregnancy, talk to your health care provider about birth control (contraception). OSTEOPOROSIS AND MENOPAUSE   Osteoporosis is a disease in which the bones lose minerals and strength with aging. This can result in serious bone fractures. Your risk for osteoporosis can be identified using a bone density scan.  If you are 57 years of age or older, or if you are at risk for osteoporosis and fractures, ask your health care provider if you should be screened.  Ask your health care provider whether you should take a calcium or vitamin D supplement to lower your risk for osteoporosis.  Menopause may  have certain physical symptoms and risks.  Hormone replacement therapy may reduce some of these symptoms and risks. Talk to your health care provider about whether hormone replacement therapy is right for you.  HOME CARE INSTRUCTIONS   Schedule regular health, dental, and eye exams.  Stay current with your immunizations.   Do not use any tobacco products including cigarettes, chewing tobacco, or electronic cigarettes.  If you are pregnant, do not drink alcohol.  If you are breastfeeding, limit how much and how often you drink alcohol.  Limit alcohol intake to no more than 1 drink per day for nonpregnant women. One drink equals 12 ounces of beer, 5 ounces of wine, or 1 ounces of hard liquor.  Do not use street drugs.  Do not share needles.  Ask your health care provider for help if you need support or information about quitting drugs.  Tell your health care provider if you often feel depressed.  Tell your health care provider if you have ever been abused or do not feel safe at home. Document Released: 09/12/2010 Document Revised: 07/14/2013 Document Reviewed: 01/29/2013 [facility] Patient Information 2015 [place], [place]. This information is not intended to replace advice given to you by your health care provider. Make sure you discuss any questions you have with your health care provider. DASH Eating Plan DASH stands for "Dietary Approaches to Stop Hypertension." The DASH eating plan is a healthy eating plan that has been shown to reduce high blood pressure (hypertension). Additional health benefits may include reducing the risk of type 2 diabetes mellitus, heart disease, and stroke. The DASH eating plan may also help with weight loss. WHAT DO I NEED TO KNOW ABOUT THE DASH EATING PLAN? For the DASH eating plan, you will follow these general guidelines:  Choose foods with a percent daily value for sodium of less than 5% (as listed on the food label).  Use salt-free seasonings or  herbs instead  of table salt or sea salt.  Check with your health care provider or pharmacist before using salt substitutes.  Eat lower-sodium products, often labeled as "lower sodium" or "no salt added."  Eat fresh foods.  Eat more vegetables, fruits, and low-fat dairy products.  Choose whole grains. Look for the word "whole" as the first word in the ingredient list.  Choose fish and skinless chicken or [place] more often than red meat. Limit fish, poultry, and meat to 6 oz (170 g) each day.  Limit sweets, desserts, sugars, and sugary drinks.  Choose heart-healthy fats.  Limit cheese to 1 oz (28 g) per day.  Eat more home-cooked food and less restaurant, buffet, and fast food.  Limit fried foods.  Cook foods using methods other than frying.  Limit canned vegetables. If you do use them, rinse them well to decrease the sodium.  When eating at a restaurant, ask that your food be prepared with less salt, or no salt if possible. WHAT FOODS CAN I EAT? Seek help from a dietitian for individual calorie needs. Grains Whole grain or whole wheat bread. Brown rice. Whole grain or whole wheat pasta. Quinoa, bulgur, and whole grain cereals. Low-sodium cereals. Corn or whole wheat flour tortillas. Whole grain cornbread. Whole grain crackers. Low-sodium crackers. Vegetables Fresh or frozen vegetables (raw, steamed, roasted, or grilled). Low-sodium or reduced-sodium tomato and vegetable juices. Low-sodium or reduced-sodium tomato sauce and paste. Low-sodium or reduced-sodium canned vegetables.  Fruits All fresh, canned (in natural juice), or frozen fruits. Meat and Other Protein Products Ground beef (85% or leaner), grass-fed beef, or beef trimmed of fat. Skinless chicken or [place]. Ground chicken or [place]. Pork trimmed of fat. All fish and seafood. Eggs. Dried beans, peas, or lentils. Unsalted nuts and seeds. Unsalted canned beans. Dairy Low-fat dairy products, such as skim or 1% milk, 2% or  reduced-fat cheeses, low-fat ricotta or cottage cheese, or plain low-fat yogurt. Low-sodium or reduced-sodium cheeses. Fats and Oils Tub margarines without trans fats. Light or reduced-fat mayonnaise and salad dressings (reduced sodium). Avocado. Safflower, olive, or canola oils. Natural peanut or almond butter. Other Unsalted popcorn and pretzels. The items listed above may not be a complete list of recommended foods or beverages. Contact your dietitian for more options. WHAT FOODS ARE NOT RECOMMENDED? Grains White bread. White pasta. White rice. Refined cornbread. Bagels and croissants. Crackers that contain trans fat. Vegetables Creamed or fried vegetables. Vegetables in a cheese sauce. Regular canned vegetables. Regular canned tomato sauce and paste. Regular tomato and vegetable juices. Fruits Dried fruits. Canned fruit in light or heavy syrup. Fruit juice. Meat and Other Protein Products Fatty cuts of meat. Ribs, chicken wings, bacon, sausage, bologna, salami, chitterlings, fatback, hot dogs, bratwurst, and packaged luncheon meats. Salted nuts and seeds. Canned beans with salt. Dairy Whole or 2% milk, cream, half-and-half, and cream cheese. Whole-fat or sweetened yogurt. Full-fat cheeses or blue cheese. Nondairy creamers and whipped toppings. Processed cheese, cheese spreads, or cheese curds. Condiments Onion and garlic salt, seasoned salt, table salt, and sea salt. Canned and packaged gravies. Worcestershire sauce. Tartar sauce. Barbecue sauce. Teriyaki sauce. Soy sauce, including reduced sodium. Steak sauce. Fish sauce. Oyster sauce. Cocktail sauce. Horseradish. Ketchup and mustard. Meat flavorings and tenderizers. Bouillon cubes. Hot sauce. Tabasco sauce. Marinades. Taco seasonings. Relishes. Fats and Oils Butter, stick margarine, lard, shortening, ghee, and bacon fat. Coconut, palm kernel, or palm oils. Regular salad dressings. Other Pickles and olives. Salted popcorn and  pretzels. The items listed above may  not be a complete list of foods and beverages to avoid. Contact your dietitian for more information. WHERE CAN I FIND MORE INFORMATION? National Heart, Lung, and Blood Institute: travelstabloid.com Document Released: 02/16/2011 Document Revised: 07/14/2013 Document Reviewed: 01/01/2013 [facility] Patient Information 2015 [place], [place]. This information is not intended to replace advice given to you by your health care provider. Make sure you discuss any questions you have with your health care provider.

## 2014-01-12 NOTE — Assessment & Plan Note (Signed)
Pt on prescription to assist with weight loss efforts.  Prior prescription for phentermine caused nausea during last trial March 2014.  Discussed potential med options and patient selected Belviq 12/2012 - took 1 week and stopped because of ?UTI symptoms Wants to retry same now need for recheck of weight after 12 weeks on med pt understands and agrees to same -  Also The patient is asked to make an attempt to improve diet and exercise patterns to aid in medical management of this problem.   Wt Readings from Last 3 Encounters:  01/12/14 171 lb 8 oz (77.792 kg)  04/24/13 165 lb (74.844 kg)  12/16/12 170 lb (77.111 kg)

## 2014-01-12 NOTE — Progress Notes (Signed)
Pre visit review using our clinic review tool, if applicable. No additional management support is needed unless otherwise documented below in the visit note. 

## 2014-01-12 NOTE — Progress Notes (Signed)
Subjective:    Patient ID: Brandy Salas, female    DOB: September 28, 1965, 48 y.o.   MRN: [id number]  HPI  patient is here today for annual physical. Patient feels well and has no complaints.  Also reviewed chronic medical issues and interval medical events  Past Medical History  Diagnosis Date  . THROMBOCYTOPENIA   . Anemia    Family History  Problem Relation Age of Onset  . Diabetes Mother   . Hypertension Mother   . Hypertension Father   . Prostate cancer Other     grandparent  . Aneurysm Mother 77    brain  . COPD Mother     smoker  . Lupus Cousin     1st cousins, mom's side   History  Substance Use Topics  . Smoking status: Never Smoker   . Smokeless tobacco: Not on file  . Alcohol Use: No    Review of Systems  Constitutional: Negative for fatigue and unexpected weight change.  Respiratory: Negative for cough, shortness of breath and wheezing.   Cardiovascular: Negative for chest pain, palpitations and leg swelling.  Gastrointestinal: Negative for nausea, abdominal pain and diarrhea.  Neurological: Negative for dizziness, weakness, light-headedness and headaches.  Psychiatric/Behavioral: Negative for dysphoric mood. The patient is not nervous/anxious.   All other systems reviewed and are negative.      Objective:   Physical Exam  BP 144/88 mmHg  Pulse 66  Temp(Src) 98.3 F (36.8 C) (Oral)  Ht 5\' 4"  (1.626 m)  Wt 171 lb 8 oz (77.792 kg)  BMI 29.42 kg/m2  SpO2 98%  LMP 01/05/2014 Wt Readings from Last 3 Encounters:  01/12/14 171 lb 8 oz (77.792 kg)  04/24/13 165 lb (74.844 kg)  12/16/12 170 lb (77.111 kg)   BP Readings from Last 3 Encounters:  01/12/14 144/88  04/24/13 130/80  12/16/12 130/72   Constitutional: She is overweight, appears well-developed and well-nourished. No distress.  HENT: Head: Normocephalic and atraumatic. Ears: B TMs ok, no erythema or effusion; Nose: Nose normal. Mouth/Throat: Oropharynx is clear and moist. No  oropharyngeal exudate.  Eyes: Conjunctivae and EOM are normal. Pupils are equal, round, and reactive to light. No scleral icterus.  Neck: Normal range of motion. Neck supple. No JVD present. No thyromegaly present.  Cardiovascular: Normal rate, regular rhythm and normal heart sounds.  No murmur heard. No BLE edema. Pulmonary/Chest: Effort normal and breath sounds normal. No respiratory distress. She has no wheezes.  Abdominal: Soft. Bowel sounds are normal. She exhibits no distension. There is no tenderness. no masses GU/breast: defer to gyn Musculoskeletal: Normal range of motion, no joint effusions. No gross deformities Neurological: She is alert and oriented to person, place, and time. No cranial nerve deficit. Coordination, balance, strength, speech and gait are normal.  Skin: Skin is warm and dry. No rash noted. No erythema.  Psychiatric: She has a normal mood and affect. Her behavior is normal. Judgment and thought content normal.    Lab Results  Component Value Date   WBC 4.5 12/10/2012   HGB 11.3* 12/10/2012   HCT 33.2* 12/10/2012   PLT 200.0 12/10/2012   GLUCOSE 83 12/10/2012   CHOL 135 12/10/2012   TRIG 122.0 12/10/2012   HDL 38.40* 12/10/2012   LDLCALC 72 12/10/2012   ALT 21 12/10/2012   AST 26 12/10/2012   NA 136 12/10/2012   K 4.2 12/10/2012   CL 106 12/10/2012   CREATININE 1.0 12/10/2012   BUN 8 12/10/2012   CO2 26  12/10/2012   TSH 1.55 12/10/2012   HGBA1C 5.3 07/05/2007    Dg Chest 2 View  05/29/2011   *RADIOLOGY REPORT*  Clinical Data: Nonsmoker with cough and shortness of breath.  CHEST - 2 VIEW  Comparison: None.  Findings: The heart size and mediastinal contours are normal. The lungs are clear. There is no pleural effusion or pneumothorax. No acute osseous findings are identified.  IMPRESSION: No active cardiopulmonary process.  Original Report Authenticated By: Vivia Ewing, M.D.      Assessment & Plan:   CPX/z00.00 - Patient has been counseled on  age-appropriate routine health concerns for screening and prevention. These are reviewed and up-to-date. Immunizations are up-to-date or declined. Labs ordered and reviewed.  Problem List Items Addressed This Visit    Elevated blood pressure    BP Readings from Last 3 Encounters:  01/12/14 144/88  04/24/13 130/80  12/16/12 130/72   Advise DASH and wt loss with aerobic exercise - recheck 6-12 weeks     Overweight    Pt on prescription to assist with weight loss efforts.  Prior prescription for phentermine caused nausea during last trial March 2014.  Discussed potential med options and patient selected Belviq 12/2012 - took 1 week and stopped because of ?UTI symptoms Wants to retry same now need for recheck of weight after 12 weeks on med pt understands and agrees to same -  Also The patient is asked to make an attempt to improve diet and exercise patterns to aid in medical management of this problem.   Wt Readings from Last 3 Encounters:  01/12/14 171 lb 8 oz (77.792 kg)  04/24/13 165 lb (74.844 kg)  12/16/12 170 lb (77.111 kg)       Other Visit Diagnoses    Routine general medical examination at a health care facility    -  Primary    Relevant Orders       Basic metabolic panel       CBC with Differential       Hepatic function panel       Lipid panel       Urinalysis, Routine w reflex microscopic       TSH

## 2014-01-12 NOTE — Assessment & Plan Note (Signed)
BP Readings from Last 3 Encounters:  01/12/14 144/88  04/24/13 130/80  12/16/12 130/72   Advise DASH and wt loss with aerobic exercise - recheck 6-12 weeks

## 2014-01-13 LAB — TSH: TSH: 1.34 u[IU]/mL (ref 0.35–4.50)

## 2014-03-18 ENCOUNTER — Encounter: Payer: Self-pay | Admitting: Internal Medicine

## 2014-03-18 ENCOUNTER — Ambulatory Visit (INDEPENDENT_AMBULATORY_CARE_PROVIDER_SITE_OTHER)
Admission: RE | Admit: 2014-03-18 | Discharge: 2014-03-18 | Disposition: A | Discharge status: Home / Self Care | Payer: 59 | Source: Ambulatory Visit | Attending: Internal Medicine | Admitting: Internal Medicine

## 2014-03-18 ENCOUNTER — Ambulatory Visit (INDEPENDENT_AMBULATORY_CARE_PROVIDER_SITE_OTHER): Payer: 59 | Admitting: Internal Medicine

## 2014-03-18 VITALS — BP 140/88 | HR 68 | Temp 98.1°F | Resp 12 | Ht 64.0 in | Wt 168.0 lb

## 2014-03-18 DIAGNOSIS — J209 Acute bronchitis, unspecified: Secondary | ICD-10-CM | POA: Insufficient documentation

## 2014-03-18 DIAGNOSIS — J202 Acute bronchitis due to streptococcus: Secondary | ICD-10-CM

## 2014-03-18 DIAGNOSIS — R059 Cough, unspecified: Secondary | ICD-10-CM

## 2014-03-18 DIAGNOSIS — R05 Cough: Secondary | ICD-10-CM

## 2014-03-18 MED ORDER — CODEINE POLST-CHLORPHEN POLST 14.7-2.8 MG/5ML PO LQCR: 10.0000 mL | Freq: Two times a day (BID) | ORAL | Status: DC | PRN

## 2014-03-18 MED ORDER — AMOXICILLIN-POT CLAVULANATE 875-125 MG PO TABS: 1.0000 | ORAL_TABLET | Freq: Two times a day (BID) | ORAL | Status: DC

## 2014-03-18 NOTE — Progress Notes (Signed)
Pre visit review using our clinic review tool, if applicable. No additional management support is needed unless otherwise documented below in the visit note. 

## 2014-03-18 NOTE — Patient Instructions (Signed)
Cough, Adult  A cough is a reflex that helps clear your throat and airways. It can help heal the body or may be a reaction to an irritated airway. A cough may only last 2 or 3 weeks (acute) or may last more than 8 weeks (chronic).  CAUSES Acute cough:  Viral or bacterial infections. Chronic cough:  Infections.  Allergies.  Asthma.  Post-nasal drip.  Smoking.  Heartburn or acid reflux.  Some medicines.  Chronic lung problems (COPD).  Cancer. SYMPTOMS   Cough.  Fever.  Chest pain.  Increased breathing rate.  High-pitched whistling sound when breathing (wheezing).  Colored mucus that you cough up (sputum). TREATMENT   A bacterial cough may be treated with antibiotic medicine.  A viral cough must run its course and will not respond to antibiotics.  Your caregiver may recommend other treatments if you have a chronic cough. HOME CARE INSTRUCTIONS   Only take over-the-counter or prescription medicines for pain, discomfort, or fever as directed by your caregiver. Use cough suppressants only as directed by your caregiver.  Use a cold steam vaporizer or humidifier in your bedroom or home to help loosen secretions.  Sleep in a semi-upright position if your cough is worse at night.  Rest as needed.  Stop smoking if you smoke. SEEK IMMEDIATE MEDICAL CARE IF:   You have pus in your sputum.  Your cough starts to worsen.  You cannot control your cough with suppressants and are losing sleep.  You begin coughing up blood.  You have difficulty breathing.  You develop pain which is getting worse or is uncontrolled with medicine.  You have a fever. MAKE SURE YOU:   Understand these instructions.  Will watch your condition.  Will get help right away if you are not doing well or get worse. Document Released: 08/26/2010 Document Revised: 05/22/2011 Document Reviewed: 08/26/2010 Exit[employer] Patient Information 2015 [employer]. This information is not intended  to replace advice given to you by your health care provider. Make sure you discuss any questions you have with your health care provider.  

## 2014-03-19 ENCOUNTER — Other Ambulatory Visit: Payer: Self-pay | Admitting: Internal Medicine

## 2014-03-19 DIAGNOSIS — R05 Cough: Secondary | ICD-10-CM

## 2014-03-19 DIAGNOSIS — R059 Cough, unspecified: Secondary | ICD-10-CM

## 2014-03-19 DIAGNOSIS — J202 Acute bronchitis due to streptococcus: Secondary | ICD-10-CM

## 2014-03-19 MED ORDER — PROMETHAZINE-DM 6.25-15 MG/5ML PO SYRP: 5.0000 mL | ORAL_SOLUTION | Freq: Four times a day (QID) | ORAL | Status: DC | PRN

## 2014-03-19 NOTE — Progress Notes (Signed)
   Subjective:    Patient ID: Brandy Salas, female    DOB: [birth year], 49 y.o.   MRN: [id number]  Cough This is a new problem. The current episode started 1 to 4 weeks ago. The problem has been gradually worsening. The problem occurs every few hours. The cough is productive of purulent sputum. Pertinent negatives include no chest pain, chills, ear congestion, ear pain, fever, headaches, heartburn, hemoptysis, myalgias, nasal congestion, postnasal drip, rash, rhinorrhea, sore throat, shortness of breath, sweats, weight loss or wheezing. She has tried OTC cough suppressant for the symptoms. The treatment provided no relief. Her past medical history is significant for pneumonia. There is no history of asthma, bronchiectasis, bronchitis, COPD, emphysema or environmental allergies.      Review of Systems  Constitutional: Negative.  Negative for fever, chills, weight loss, diaphoresis and fatigue.  HENT: Negative.  Negative for ear pain, postnasal drip, rhinorrhea and sore throat.   Eyes: Negative.   Respiratory: Positive for cough. Negative for apnea, hemoptysis, choking, chest tightness, shortness of breath, wheezing and stridor.   Cardiovascular: Negative.  Negative for chest pain, palpitations and leg swelling.  Gastrointestinal: Negative.  Negative for heartburn, nausea, vomiting, abdominal pain, diarrhea, constipation and blood in stool.  Endocrine: Negative.   Genitourinary: Negative.   Musculoskeletal: Negative.  Negative for myalgias, back pain and arthralgias.  Skin: Negative.  Negative for rash.  Allergic/Immunologic: Negative.  Negative for environmental allergies.  Neurological: Negative.  Negative for headaches.  Hematological: Negative.  Negative for adenopathy. Does not bruise/bleed easily.  Psychiatric/Behavioral: Negative.        Objective:   Physical Exam  Constitutional: She is oriented to person, place, and time. She appears well-developed and well-nourished.   Non-toxic appearance. She does not have a sickly appearance. She does not appear ill. No distress.  HENT:  Head: Normocephalic and atraumatic.  Mouth/Throat: Oropharynx is clear and moist. No oropharyngeal exudate.  Eyes: Conjunctivae are normal. Right eye exhibits no discharge. Left eye exhibits no discharge. No scleral icterus.  Neck: Normal range of motion. Neck supple. No JVD present. No tracheal deviation present. No thyromegaly present.  Cardiovascular: Normal rate, regular rhythm, normal heart sounds and intact distal pulses.  Exam reveals no gallop and no friction rub.   No murmur heard. Pulmonary/Chest: Effort normal and breath sounds normal. No stridor. No respiratory distress. She has no wheezes. She has no rales. She exhibits no tenderness.  Abdominal: Soft. Bowel sounds are normal. She exhibits no distension and no mass. There is no tenderness. There is no rebound and no guarding.  Musculoskeletal: Normal range of motion. She exhibits no edema or tenderness.  Lymphadenopathy:    She has no cervical adenopathy.  Neurological: She is oriented to person, place, and time.  Skin: Skin is warm and dry. No rash noted. She is not diaphoretic. No erythema. No pallor.  Vitals reviewed.         Assessment & Plan:

## 2014-03-19 NOTE — Assessment & Plan Note (Signed)
Her CRX is normal Will control the cough with phenergan-dm

## 2014-03-19 NOTE — Assessment & Plan Note (Signed)
Will treat the infection with augmentin and will control the cough with phenergan-dm

## 2014-03-26 ENCOUNTER — Encounter: Payer: Self-pay | Admitting: Internal Medicine

## 2014-05-06 ENCOUNTER — Ambulatory Visit: Payer: 59

## 2014-05-06 VITALS — BP 144/86

## 2014-05-06 DIAGNOSIS — Z0130 Encounter for examination of blood pressure without abnormal findings: Secondary | ICD-10-CM

## 2015-02-10 ENCOUNTER — Other Ambulatory Visit: Payer: Self-pay | Admitting: Obstetrics and Gynecology

## 2015-02-10 DIAGNOSIS — N6310 Unspecified lump in the right breast, unspecified quadrant: Principal | ICD-10-CM

## 2015-02-10 DIAGNOSIS — N6315 Unspecified lump in the right breast, overlapping quadrants: Secondary | ICD-10-CM

## 2015-02-19 ENCOUNTER — Ambulatory Visit
Admission: RE | Admit: 2015-02-19 | Discharge: 2015-02-19 | Disposition: A | Discharge status: Home / Self Care | Payer: 59 | Source: Ambulatory Visit | Attending: Obstetrics and Gynecology | Admitting: Obstetrics and Gynecology

## 2015-02-19 DIAGNOSIS — N6315 Unspecified lump in the right breast, overlapping quadrants: Secondary | ICD-10-CM

## 2015-02-19 DIAGNOSIS — N6310 Unspecified lump in the right breast, unspecified quadrant: Principal | ICD-10-CM

## 2015-02-22 ENCOUNTER — Telehealth: Payer: Self-pay | Admitting: Gastroenterology

## 2015-02-23 NOTE — Telephone Encounter (Signed)
I have given the information to Dr Silverio Decamp, but I think I may have told her to give it back to [place].

## 2015-03-11 NOTE — Telephone Encounter (Signed)
Dr. Silverio Decamp has reviewed colon report. Recall Colon due 07/2015. Recall colon entered.

## 2015-03-30 ENCOUNTER — Other Ambulatory Visit: Payer: Self-pay | Admitting: Obstetrics and Gynecology

## 2015-03-30 DIAGNOSIS — N6459 Other signs and symptoms in breast: Secondary | ICD-10-CM

## 2015-04-09 ENCOUNTER — Ambulatory Visit
Admission: RE | Admit: 2015-04-09 | Discharge: 2015-04-09 | Disposition: A | Discharge status: Home / Self Care | Payer: 59 | Source: Ambulatory Visit | Attending: Obstetrics and Gynecology | Admitting: Obstetrics and Gynecology

## 2015-04-09 DIAGNOSIS — N6459 Other signs and symptoms in breast: Secondary | ICD-10-CM

## 2015-05-26 ENCOUNTER — Encounter: Payer: Self-pay | Admitting: Gastroenterology

## 2015-06-15 ENCOUNTER — Encounter: Payer: Self-pay | Admitting: Gastroenterology

## 2015-08-06 ENCOUNTER — Ambulatory Visit (AMBULATORY_SURGERY_CENTER): Payer: Self-pay

## 2015-08-06 DIAGNOSIS — Z83719 Family history of colon polyps, unspecified: Secondary | ICD-10-CM

## 2015-08-06 DIAGNOSIS — Z8371 Family history of colonic polyps: Secondary | ICD-10-CM

## 2015-08-06 MED ORDER — SUPREP BOWEL PREP KIT 17.5-3.13-1.6 GM/177ML PO SOLN: 1.0000 | Freq: Once | ORAL | Status: DC

## 2015-08-06 NOTE — Progress Notes (Signed)
No allergies to eggs or soy No past problems with anesthesia No diet meds No home oxygen  Has email and internet; declined emmi 

## 2015-08-12 ENCOUNTER — Encounter: Payer: Self-pay | Admitting: Gastroenterology

## 2015-08-20 ENCOUNTER — Encounter: Payer: 59 | Admitting: Gastroenterology

## 2015-09-15 ENCOUNTER — Other Ambulatory Visit: Payer: Self-pay | Admitting: Obstetrics and Gynecology

## 2015-09-15 DIAGNOSIS — Z1231 Encounter for screening mammogram for malignant neoplasm of breast: Secondary | ICD-10-CM

## 2015-10-15 ENCOUNTER — Ambulatory Visit
Admission: RE | Admit: 2015-10-15 | Discharge: 2015-10-15 | Disposition: A | Discharge status: Home / Self Care | Payer: 59 | Source: Ambulatory Visit | Attending: Obstetrics and Gynecology | Admitting: Obstetrics and Gynecology

## 2015-10-15 DIAGNOSIS — Z1231 Encounter for screening mammogram for malignant neoplasm of breast: Secondary | ICD-10-CM

## 2016-01-12 LAB — IFOBT (OCCULT BLOOD): IFOBT: NEGATIVE

## 2016-01-14 ENCOUNTER — Encounter: Payer: Self-pay | Admitting: Internal Medicine

## 2016-03-25 DIAGNOSIS — H1131 Conjunctival hemorrhage, right eye: Secondary | ICD-10-CM | POA: Diagnosis not present

## 2016-08-01 ENCOUNTER — Encounter: Payer: Self-pay | Admitting: Gastroenterology

## 2016-08-21 IMAGING — MG 2D DIGITAL SCREENING BILATERAL MAMMOGRAM WITH CAD AND ADJUNCT TO
8 of 12 series · 8 of 28 positions shown · non-contrast
Comparison: Previous exam(s).

CLINICAL DATA: Screening.

EXAM:
2D DIGITAL SCREENING BILATERAL MAMMOGRAM WITH CAD AND ADJUNCT TOMO

[R CC synth-2D]
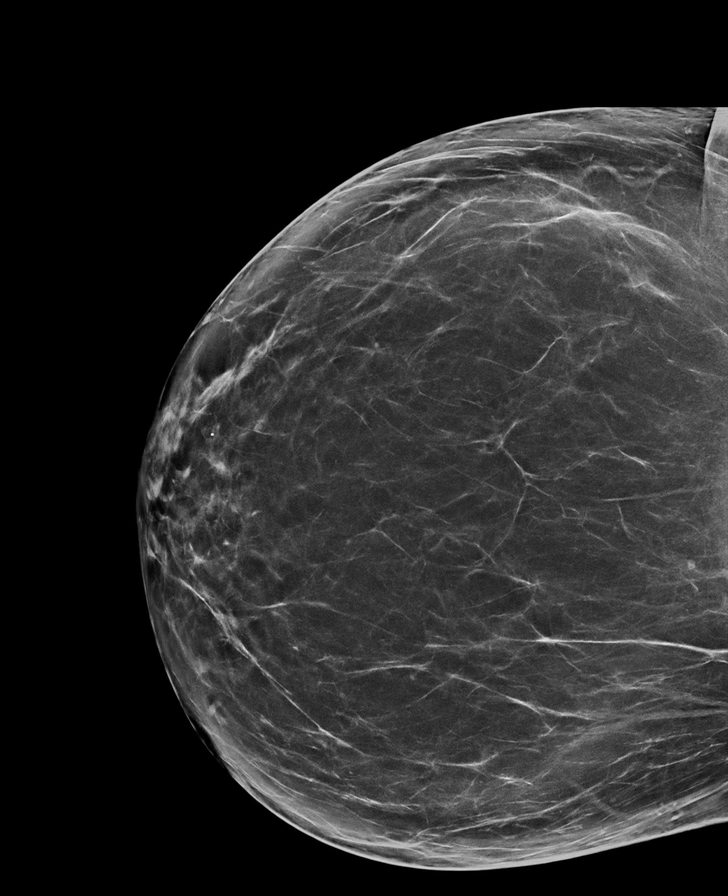

[R MLO synth-2D]
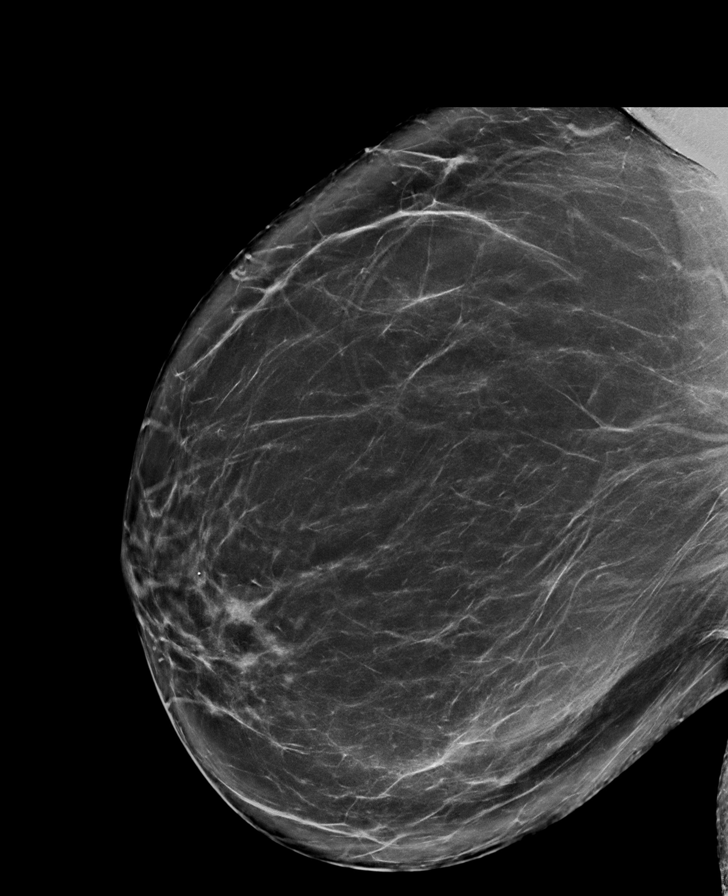

[L CC synth-2D]
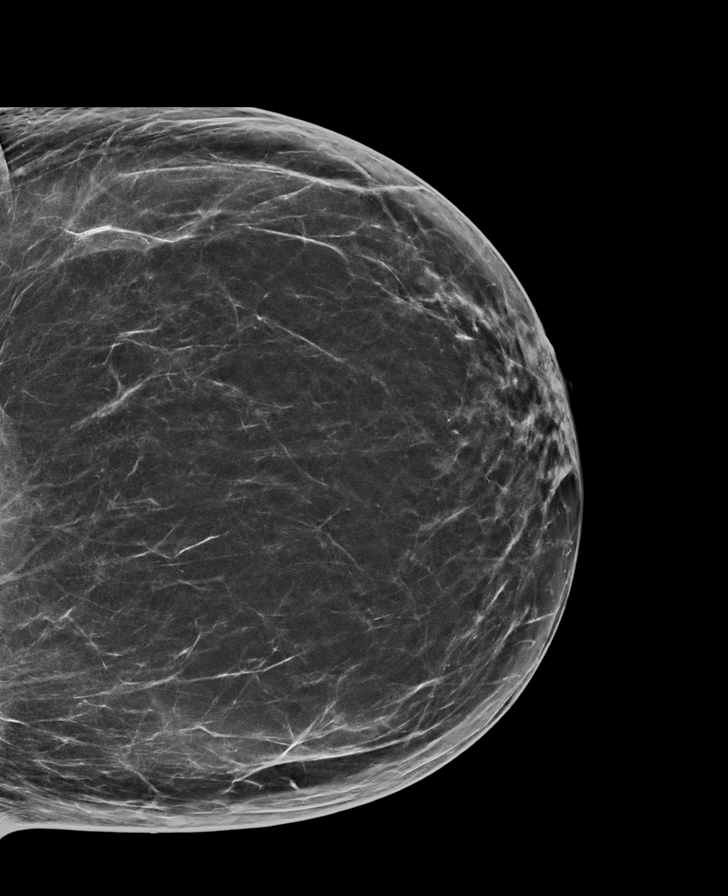

[L MLO synth-2D]
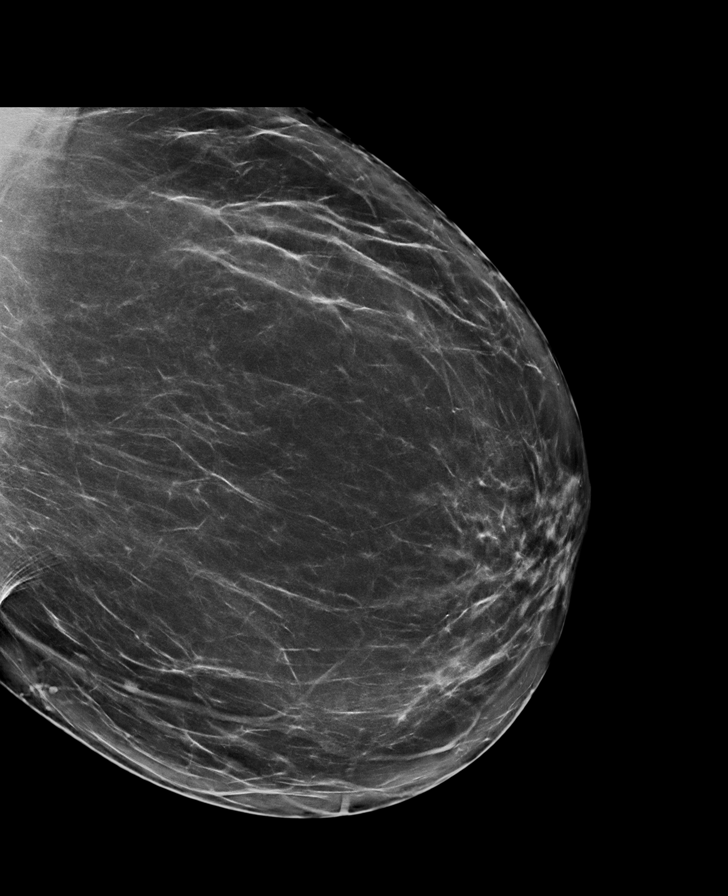

[L CC]
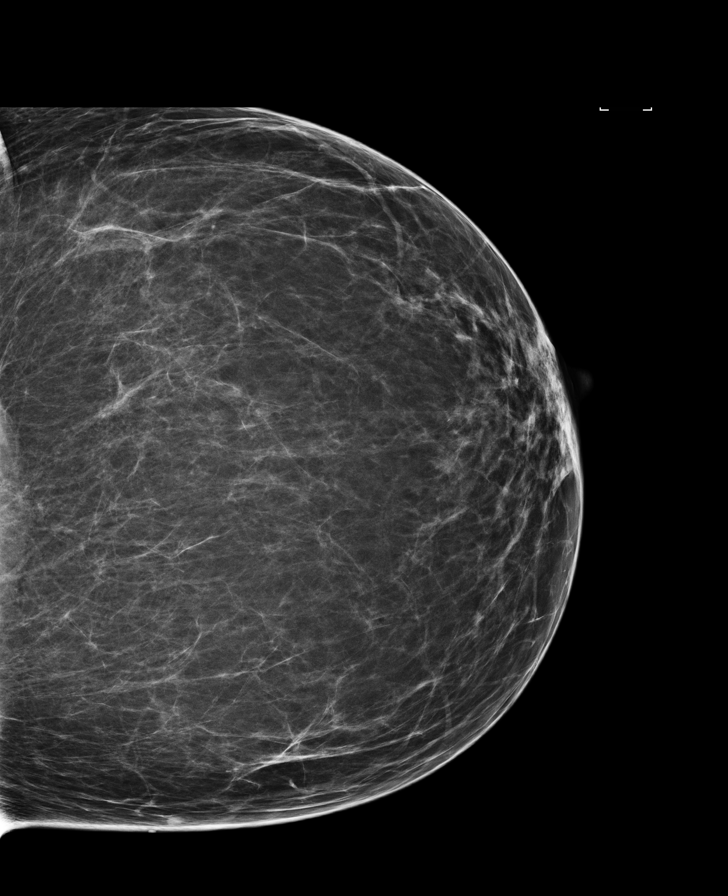

[R CC]
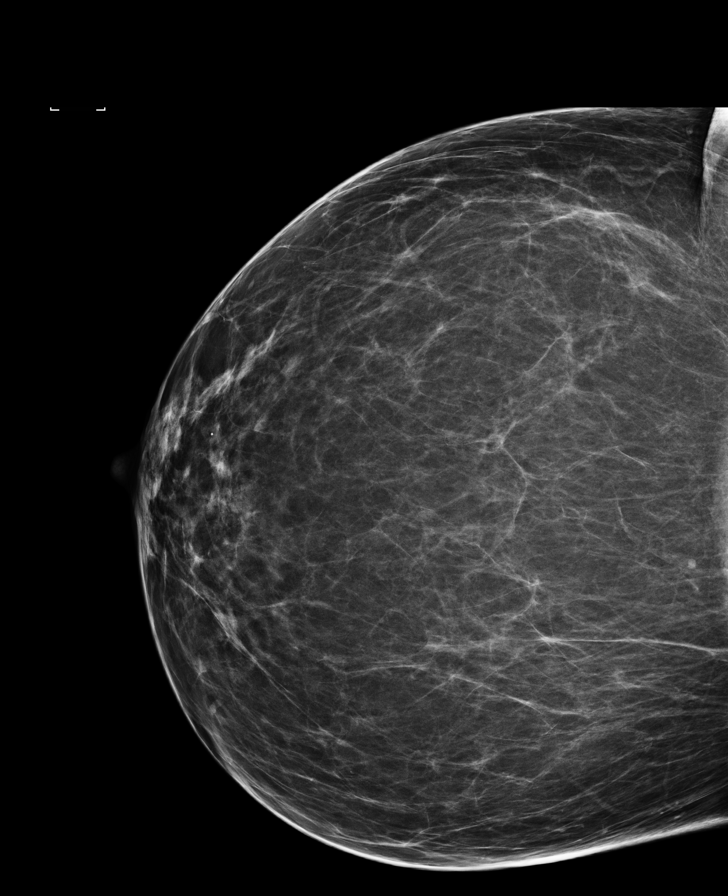

[R MLO]
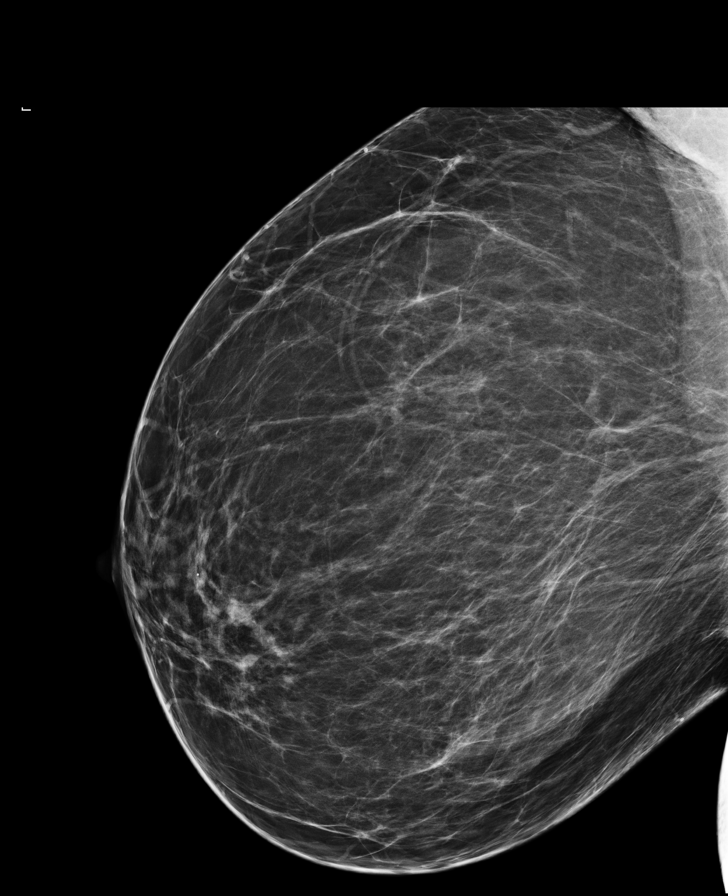

[L MLO]
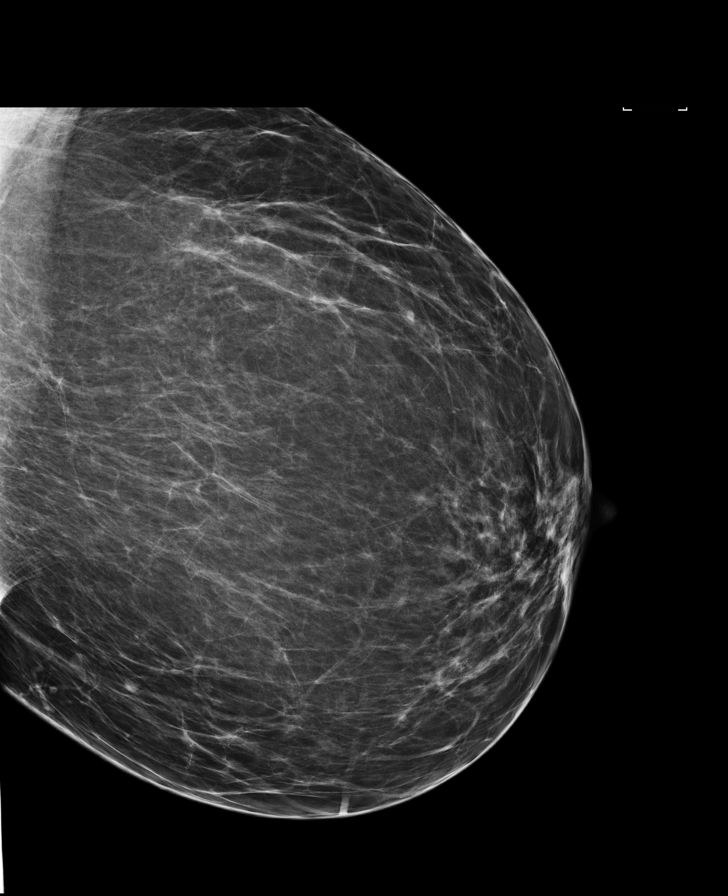

[8 of 28 positions shown; findings below may reference images not displayed]

ACR Breast Density Category b: There are scattered areas of
fibroglandular density.
FINDINGS: There are no findings suspicious for malignancy. Images were
processed with CAD.
IMPRESSION: No mammographic evidence of malignancy. A result letter of this
screening mammogram will be mailed directly to the patient.

RECOMMENDATION:
Screening mammogram in one year. (Code:97-6-RS4)

BI-RADS CATEGORY  1: Negative.

## 2016-08-25 ENCOUNTER — Encounter: Payer: Self-pay | Admitting: *Deleted

## 2016-08-31 ENCOUNTER — Encounter: Payer: Self-pay | Admitting: Gastroenterology

## 2016-08-31 ENCOUNTER — Ambulatory Visit (INDEPENDENT_AMBULATORY_CARE_PROVIDER_SITE_OTHER): Payer: 59 | Admitting: Gastroenterology

## 2016-08-31 VITALS — BP 128/66 | HR 64 | Ht 64.0 in | Wt 175.6 lb

## 2016-08-31 DIAGNOSIS — K625 Hemorrhage of anus and rectum: Secondary | ICD-10-CM

## 2016-08-31 DIAGNOSIS — Z1211 Encounter for screening for malignant neoplasm of colon: Secondary | ICD-10-CM

## 2016-08-31 DIAGNOSIS — N8189 Other female genital prolapse: Secondary | ICD-10-CM | POA: Diagnosis not present

## 2016-08-31 DIAGNOSIS — K5909 Other constipation: Secondary | ICD-10-CM | POA: Diagnosis not present

## 2016-08-31 DIAGNOSIS — K648 Other hemorrhoids: Secondary | ICD-10-CM

## 2016-08-31 MED ORDER — NA SULFATE-K SULFATE-MG SULF 17.5-3.13-1.6 GM/177ML PO SOLN: 1.0000 | Freq: Once | ORAL | 0 refills | Status: AC

## 2016-08-31 NOTE — Progress Notes (Signed)
Brandy Salas    [id number]    Aug 03, 1965  Primary Care Physician:Tisovec, Fransico Him, MD  Referring Physician: Haywood Pao, MD [address] [place], [place] [address]  Chief complaint:  Constipation, hemorrhoids, BRBPR, colorectal cancer screening  HPI: [name] remotely followed by Dr Brandy Salas, was last seen in office in May 2007 is here to reestablish care.  She has history of chronic constipation with irregular bowel habits, she usually has a bowel once every 1-3 days. Associated bloatingTakes OTC stool softeners as needed.  Occasional bright red blood per rectum when she strains excessively, about once a month for many years, thinks she is bleeding from hemorrhoids. denies any discomfort in anal or rectal region, but feels bulging of rectum and also ?vaginal prolapse. Denies any nausea, vomiting, abdominal pain, or melena.  Last colonoscopy in May 2007 was normal exam.    Outpatient Encounter Prescriptions as of 08/31/2016  Medication Sig  . Multiple Vitamins-Minerals (WOMENS ONE DAILY) TABS Take by mouth daily.    . [DISCONTINUED] amoxicillin (AMOXIL) 875 MG tablet Take 875 mg by mouth 2 (two) times daily. For dental procedure  . [DISCONTINUED] SUPREP BOWEL PREP KIT 17.5-3.13-1.6 GM/180ML SOLN Take 1 kit by mouth once.   No facility-administered encounter medications on file as of 08/31/2016.     Allergies as of 08/31/2016  . (No Known Allergies)    Past Medical History:  Diagnosis Date  . Anemia   . Arm fracture    as a child  . Nocturia   . Seasonal allergies   . THROMBOCYTOPENIA    pt denies    Past Surgical History:  Procedure Laterality Date  . CESAREAN SECTION    . COLONOSCOPY    . SVD     spontaneous vaginal delivery    Family History  Problem Relation Age of Onset  . Diabetes Mother   . Hypertension Mother   . Aneurysm Mother 42       brain  . COPD Mother        smoker  . Colon polyps Mother   . Breast cancer Mother     . Hypertension Father   . Lupus Cousin        1st cousins, mom's side  . Prostate cancer Paternal Grandfather   . Colon cancer Neg Hx   . Esophageal cancer Neg Hx   . Stomach cancer Neg Hx   . Pancreatic cancer Neg Hx     Social History   Social History  . Marital status: Married    Spouse name: N/A  . Number of children: N/A  . Years of education: N/A   Occupational History  . Not on file.   Social History Main Topics  . Smoking status: Never Smoker  . Smokeless tobacco: Never Used  . Alcohol use 0.0 oz/week     Comment: occasional  . Drug use: No  . Sexual activity: Not on file   Other Topics Concern  . Not on file   Social History Narrative   Married lives with spouse, teenage son and twin dtrs. 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  HENT: Negative.   Eyes: Negative for blurred vision.  Respiratory: Negative for cough, shortness of breath and wheezing.   Cardiovascular: Negative for chest pain and palpitations.  Gastrointestinal: as per HPI Genitourinary:  Negative for dysuria, urgency, frequency and hematuria.  Musculoskeletal: Negative for myalgias, back pain and joint pain.  Skin: Negative for itching and rash.  Neurological: Negative for dizziness, tremors, focal weakness, seizures and loss of consciousness.  Endo/Heme/Allergies: Positive for seasonal allergies.  Psychiatric/Behavioral: Negative for depression, suicidal ideas and hallucinations.  All other systems reviewed and are negative.   Physical Exam: Vitals:   08/31/16 0852  BP: 128/66  Pulse: 64   Body mass index is 30.14 kg/m. Gen:      No acute distress HEENT:  EOMI, sclera anicteric Neck:     No masses; no thyromegaly Lungs:    Clear to auscultation bilaterally; normal respiratory effort CV:         Regular rate and rhythm; no murmurs Abd:      + bowel sounds; soft, non-tender; no palpable  masses, no distension Ext:    No edema; adequate peripheral perfusion Skin:      Warm and dry; no rash Neuro: alert and oriented x 3 Psych: normal mood and affect Patient declined rectal exam  Data Reviewed:  Reviewed labs, radiology imaging, old records and pertinent past GI work up   Assessment and Plan/Recommendations:  50 yr F with chronic constipation, BRBPR, symptomatic hemorrhoids, and pelvic floor laxity  Chronic constipation: Advised patient to increase dietary fiber and fluid intake 8-10 cups daily Start benefiber 1 tablespoon TID with meals  BRBPR: likely secondary to bleeding internal hemorrhoids, small volume Will consider hemorrhoidal band ligation after colonoscopy  Due for colorectal cancer screening, average risk The risks and benefits as well as alternatives of endoscopic procedure(s) have been discussed and reviewed. All questions answered. The patient agrees to proceed.  Pelvic floor laxity and ?vaginal prolapse: advised patient to follow up Gyn Instructions given for Kegel exercise to improve pelvic floor strength    K. Denzil Magnuson , MD [phone] Mon-Fri 8a-5p [phone] after 5p, weekends, holidays  CC: Tisovec, Fransico Him, MD

## 2016-08-31 NOTE — Patient Instructions (Addendum)
You have been scheduled for a colonoscopy. Please follow written instructions given to you at your visit today.  Please pick up your prep supplies at the pharmacy within the next 1-3 days. If you use inhalers (even only as needed), please bring them with you on the day of your procedure. Your physician has requested that you go to www.startemmi.com and enter the access code given to you at your visit today. This web site gives a general overview about your procedure. However, you should still follow specific instructions given to you by our office regarding your preparation for the procedure.  Use Benefiber 1 tablespoon three times a day as needed  Hemorrhoidal banding brochure given    Constipation, Adult Constipation is when a person:  Poops (has a bowel movement) fewer times in a week than normal.  Has a hard time pooping.  Has poop that is dry, hard, or bigger than normal.  Follow these instructions at home: Eating and drinking   Eat foods that have a lot of fiber, such as: ? Fresh fruits and vegetables. ? Whole grains. ? Beans.  Eat less of foods that are high in fat, low in fiber, or overly processed, such as: ? [place] fries. ? Hamburgers. ? Cookies. ? Candy. ? Soda.  Drink enough fluid to keep your pee (urine) clear or pale yellow. General instructions  Exercise regularly or as told by your doctor.  Go to the restroom when you feel like you need to poop. Do not hold it in.  Take over-the-counter and prescription medicines only as told by your doctor. These include any fiber supplements.  Do pelvic floor retraining exercises, such as: ? Doing deep breathing while relaxing your lower belly (abdomen). ? Relaxing your pelvic floor while pooping.  Watch your condition for any changes.  Keep all follow-up visits as told by your doctor. This is important. Contact a doctor if:  You have pain that gets worse.  You have a fever.  You have not pooped for 4  days.  You throw up (vomit).  You are not hungry.  You lose weight.  You are bleeding from the anus.  You have thin, pencil-like poop (stool). Get help right away if:  You have a fever, and your symptoms suddenly get worse.  You leak poop or have blood in your poop.  Your belly feels hard or bigger than normal (is bloated).  You have very bad belly pain.  You feel dizzy or you faint. This information is not intended to replace advice given to you by your health care provider. Make sure you discuss any questions you have with your health care provider. Document Released: 08/16/2007 Document Revised: 09/17/2015 Document Reviewed: 08/18/2015 Elsevier Interactive Patient Education  2017 [employer].    Kegel Exercises Kegel exercises help strengthen the muscles that support the rectum, vagina, small intestine, bladder, and uterus. Doing Kegel exercises can help:  Improve bladder and bowel control.  Improve sexual response.  Reduce problems and discomfort during pregnancy.  Kegel exercises involve squeezing your pelvic floor muscles, which are the same muscles you squeeze when you try to stop the flow of urine. The exercises can be done while sitting, standing, or lying down, but it is best to vary your position. Phase 1 exercises 1. Squeeze your pelvic floor muscles tight. You should feel a tight lift in your rectal area. If you are a female, you should also feel a tightness in your vaginal area. Keep your stomach, buttocks, and legs  relaxed. 2. Hold the muscles tight for up to 10 seconds. 3. Relax your muscles. Repeat this exercise 50 times a day or as many times as told by your health care provider. Continue to do this exercise for at least 4-6 weeks or for as long as told by your health care provider. This information is not intended to replace advice given to you by your health care provider. Make sure you discuss any questions you have with your health care  provider. Document Released: 02/14/2012 Document Revised: 10/23/2015 Document Reviewed: 01/17/2015 Elsevier Interactive Patient Education  [employer].

## 2016-10-16 ENCOUNTER — Encounter: Payer: Self-pay | Admitting: Gastroenterology

## 2016-10-23 DIAGNOSIS — Z1231 Encounter for screening mammogram for malignant neoplasm of breast: Secondary | ICD-10-CM | POA: Diagnosis not present

## 2016-10-23 DIAGNOSIS — Z01419 Encounter for gynecological examination (general) (routine) without abnormal findings: Secondary | ICD-10-CM | POA: Diagnosis not present

## 2016-10-23 DIAGNOSIS — Z124 Encounter for screening for malignant neoplasm of cervix: Secondary | ICD-10-CM | POA: Diagnosis not present

## 2016-10-27 ENCOUNTER — Ambulatory Visit (AMBULATORY_SURGERY_CENTER): Payer: 59 | Admitting: Gastroenterology

## 2016-10-27 ENCOUNTER — Encounter: Payer: Self-pay | Admitting: Gastroenterology

## 2016-10-27 VITALS — BP 121/74 | HR 76 | Temp 97.7°F | Resp 12 | Ht 64.0 in | Wt 175.0 lb

## 2016-10-27 DIAGNOSIS — Z1212 Encounter for screening for malignant neoplasm of rectum: Secondary | ICD-10-CM

## 2016-10-27 DIAGNOSIS — D122 Benign neoplasm of ascending colon: Secondary | ICD-10-CM

## 2016-10-27 DIAGNOSIS — Z1211 Encounter for screening for malignant neoplasm of colon: Secondary | ICD-10-CM | POA: Diagnosis present

## 2016-10-27 MED ORDER — SODIUM CHLORIDE 0.9 % IV SOLN: 500.0000 mL | INTRAVENOUS | Status: AC

## 2016-10-27 NOTE — Op Note (Signed)
[facility] Patient Name: Brandy Salas Procedure Date: 10/27/2016 8:10 AM MRN: [id number] Endoscopist: Mauri Pole , MD Age: 51 Referring MD:  Date of Birth: Feb 17, 1966 Gender: Female Account #: [id number] Procedure:                Colonoscopy Indications:              Screening for colorectal malignant neoplasm Medicines:                Monitored Anesthesia Care Procedure:                Pre-Anesthesia Assessment:                           - Prior to the procedure, a History and Physical                            was performed, and patient medications and                            allergies were reviewed. The patient's tolerance of                            previous anesthesia was also reviewed. The risks                            and benefits of the procedure and the sedation                            options and risks were discussed with the patient.                            All questions were answered, and informed consent                            was obtained. Prior Anticoagulants: The patient has                            taken no previous anticoagulant or antiplatelet                            agents. ASA Grade Assessment: II - A patient with                            mild systemic disease. After reviewing the risks                            and benefits, the patient was deemed in                            satisfactory condition to undergo the procedure.                           After obtaining informed consent, the colonoscope  was passed under direct vision. Throughout the                            procedure, the patient's blood pressure, pulse, and                            oxygen saturations were monitored continuously. The                            Colonoscope was introduced through the anus and                            advanced to the the terminal ileum, with                            identification  of the appendiceal orifice and IC                            valve. The colonoscopy was performed without                            difficulty. The patient tolerated the procedure                            well. The quality of the bowel preparation was                            excellent. The terminal ileum, ileocecal valve,                            appendiceal orifice, and rectum were photographed. Scope In: 8:11:15 AM Scope Out: 8:26:28 AM Scope Withdrawal Time: 0 hours 8 minutes 41 seconds  Total Procedure Duration: 0 hours 15 minutes 13 seconds  Findings:                 The perianal and digital rectal examinations were                            normal.                           A 5 mm polyp was found in the ascending colon. The                            polyp was sessile. The polyp was removed with a                            cold snare. Resection and retrieval were complete.                           Non-bleeding internal hemorrhoids were found during                            retroflexion. The hemorrhoids were small. Complications:  No immediate complications. Estimated Blood Loss:     Estimated blood loss was minimal. Impression:               - One 5 mm polyp in the ascending colon, removed                            with a cold snare. Resected and retrieved.                           - Non-bleeding internal hemorrhoids. Recommendation:           - Patient has a contact number available for                            emergencies. The signs and symptoms of potential                            delayed complications were discussed with the                            patient. Return to normal activities tomorrow.                            Written discharge instructions were provided to the                            patient.                           - Resume previous diet.                           - Continue present medications.                           -  Await pathology results.                           - Repeat colonoscopy in 5 years for surveillance                            based on pathology results. Mauri Pole, MD 10/27/2016 8:31:11 AM This report has been signed electronically.

## 2016-10-27 NOTE — Progress Notes (Signed)
To recovery, report to RN, VSS. 

## 2016-10-27 NOTE — Progress Notes (Signed)
Called to room to assist during endoscopic procedure.  Patient ID and intended procedure confirmed with present staff. Received instructions for my participation in the procedure from the performing physician.  

## 2016-10-27 NOTE — Patient Instructions (Signed)
YOU HAD AN ENDOSCOPIC PROCEDURE TODAY AT THE [facility]:   Refer to the procedure report that was given to you for any specific questions about what was found during the examination.  If the procedure report does not answer your questions, please call your gastroenterologist to clarify.  If you requested that your care partner not be given the details of your procedure findings, then the procedure report has been included in a sealed envelope for you to review at your convenience later.  YOU SHOULD EXPECT: Some feelings of bloating in the abdomen. Passage of more gas than usual.  Walking can help get rid of the air that was put into your GI tract during the procedure and reduce the bloating. If you had a lower endoscopy (such as a colonoscopy or flexible sigmoidoscopy) you may notice spotting of blood in your stool or on the toilet paper. If you underwent a bowel prep for your procedure, you may not have a normal bowel movement for a few days.  Please Note:  You might notice some irritation and congestion in your nose or some drainage.  This is from the oxygen used during your procedure.  There is no need for concern and it should clear up in a day or so.  SYMPTOMS TO REPORT IMMEDIATELY:   Following lower endoscopy (colonoscopy or flexible sigmoidoscopy):  Excessive amounts of blood in the stool  Significant tenderness or worsening of abdominal pains  Swelling of the abdomen that is new, acute  Fever of 100F or higher   For urgent or emergent issues, a gastroenterologist can be reached at any hour by calling [phone].   DIET:  We do recommend a small meal at first, but then you may proceed to your regular diet.  Drink plenty of fluids but you should avoid alcoholic beverages for 24 hours.  ACTIVITY:  You should plan to take it easy for the rest of today and you should NOT DRIVE or use heavy machinery until tomorrow (because of the sedation medicines used during the test).     FOLLOW UP: Our staff will call the number listed on your records the next business day following your procedure to check on you and address any questions or concerns that you may have regarding the information given to you following your procedure. If we do not reach you, we will leave a message.  However, if you are feeling well and you are not experiencing any problems, there is no need to return our call.  We will assume that you have returned to your regular daily activities without incident.  If any biopsies were taken you will be contacted by phone or by letter within the next 1-3 weeks.  Please call us at [phone] if you have not heard about the biopsies in 3 weeks.    SIGNATURES/CONFIDENTIALITY: You and/or your care partner have signed paperwork which will be entered into your electronic medical record.  These signatures attest to the fact that that the information above on your After Visit Summary has been reviewed and is understood.  Full responsibility of the confidentiality of this discharge information lies with you and/or your care-partner.  Read all handouts given to you by your recovery room nurse. 

## 2016-10-31 ENCOUNTER — Telehealth: Payer: Self-pay | Admitting: *Deleted

## 2016-10-31 NOTE — Telephone Encounter (Signed)
  Follow up Call-  Call back number 10/27/2016  Post procedure Call Back phone  # [phone]  Permission to leave phone message Yes  Some recent data might be hidden     Patient questions:  Do you have a fever, pain , or abdominal swelling? No. Pain Score  0 *  Have you tolerated food without any problems? Yes.    Have you been able to return to your normal activities? Yes.    Do you have any questions about your discharge instructions: Diet   No. Medications  No. Follow up visit  No.  Do you have questions or concerns about your Care? No.  Actions: * If pain score is 4 or above: No action needed, pain <4.

## 2016-11-02 ENCOUNTER — Telehealth: Payer: Self-pay | Admitting: Internal Medicine

## 2016-11-02 NOTE — Telephone Encounter (Signed)
Received records from [facility] for appointment on 11/16/16 with Dr Debara Pickett.  Records put with Dr Lysbeth Penner schedule for 11/16/16. lp

## 2016-11-04 ENCOUNTER — Encounter: Payer: Self-pay | Admitting: Gastroenterology

## 2016-11-06 NOTE — Progress Notes (Signed)
PROCEDURE NOTE: The patient presents with symptomatic grade II hemorrhoids, requesting rubber band ligation of his/her hemorrhoidal disease.  All risks, benefits and alternative forms of therapy were described and informed consent was obtained.  In the Left Lateral Decubitus position anoscopic examination revealed grade II hemorrhoids in the right anterior, right posterior and left lateral position(s).  The anorectum was pre-medicated with 0.125% Nitroglycerine and recticare The decision was made to band the Right anterior internal hemorrhoid, and the Clawson was used to perform band ligation without complication.  Digital anorectal examination was then performed to assure proper positioning of the band, and to adjust the banded tissue as required.  The patient was discharged home without pain or other issues.  Dietary and behavioral recommendations were given and along with follow-up instructions.    The patient will return in 2-4 weeks for  follow-up and possible additional banding as required. No complications were encountered and the patient tolerated the procedure well.  Damaris Hippo , MD [phone] Mon-Fri 8a-5p [phone] after 5p, weekends, holidays

## 2016-11-07 ENCOUNTER — Encounter: Payer: Self-pay | Admitting: Gastroenterology

## 2016-11-07 ENCOUNTER — Ambulatory Visit (INDEPENDENT_AMBULATORY_CARE_PROVIDER_SITE_OTHER): Payer: 59 | Admitting: Gastroenterology

## 2016-11-07 VITALS — BP 130/82 | HR 72 | Ht 64.0 in | Wt 175.0 lb

## 2016-11-07 DIAGNOSIS — K641 Second degree hemorrhoids: Secondary | ICD-10-CM

## 2016-11-07 NOTE — Patient Instructions (Signed)

## 2016-11-16 ENCOUNTER — Ambulatory Visit: Payer: 59 | Admitting: Internal Medicine

## 2016-11-16 ENCOUNTER — Ambulatory Visit: Payer: Self-pay | Admitting: Internal Medicine

## 2016-11-17 ENCOUNTER — Encounter: Payer: Self-pay | Admitting: Internal Medicine

## 2016-11-17 ENCOUNTER — Ambulatory Visit (INDEPENDENT_AMBULATORY_CARE_PROVIDER_SITE_OTHER): Payer: 59 | Admitting: Internal Medicine

## 2016-11-17 VITALS — BP 118/70 | HR 69 | Ht 64.0 in | Wt 173.0 lb

## 2016-11-17 DIAGNOSIS — R9431 Abnormal electrocardiogram [ECG] [EKG]: Secondary | ICD-10-CM

## 2016-11-17 DIAGNOSIS — R079 Chest pain, unspecified: Secondary | ICD-10-CM | POA: Diagnosis not present

## 2016-11-17 NOTE — Patient Instructions (Signed)
Your physician has requested that you have an exercise tolerance test. For further information please visit [email or web address]. Please also follow instruction sheet, as given.  Your physician recommends that you schedule a follow-up appointment as needed.

## 2016-11-20 DIAGNOSIS — R9431 Abnormal electrocardiogram [ECG] [EKG]: Secondary | ICD-10-CM | POA: Insufficient documentation

## 2016-11-20 DIAGNOSIS — R079 Chest pain, unspecified: Secondary | ICD-10-CM | POA: Insufficient documentation

## 2016-11-20 NOTE — Progress Notes (Signed)
OFFICE CONSULT NOTE  Chief Complaint:  Bilateral chest pain  Primary Care Physician: Tisovec, Fransico Him, MD  HPI:  Brandy Salas is a 51 y.o. female who is being seen today for the evaluation of bilateral chest pain at the request of Vanessa Kick, MD. This is a 51 year old female with a history of hemorrhoids and no other significant medical problems. She works as a Advertising account planner. There is a family history of stroke, diabetes and hypertension in her family. Recently she's been having some chest wall pain which is worse when she exercises. She gets a dull pain in her chest and sometimes feels like it goes through her back. It's worse when breathing or when bending over to pick up objects. She can also feel it under her right breast as well. She self reported as having pendulous breasts and feels that it may be related to that. She says that she's not had any recent episodes of this. She is a nonsmoker, denies any drug use and drinks a maximum of 3 glasses of wine per week. There is no personal history of hypertension, diabetes, significant dyslipidemia and she does get regular care from her primary care provider. Her chest pain does not radiate to her jaw or left arm, is not associated with nausea, diaphoresis or improved quickly with rest.  PMHx:  Past Medical History:  Diagnosis Date  . Anemia   . Arm fracture    as a child  . Nocturia   . Seasonal allergies   . THROMBOCYTOPENIA    pt denies    Past Surgical History:  Procedure Laterality Date  . CESAREAN SECTION    . COLONOSCOPY    . SVD     spontaneous vaginal delivery    FAMHx:  Family History  Problem Relation Age of Onset  . Diabetes Mother   . Hypertension Mother   . Aneurysm Mother 47       brain  . COPD Mother        smoker  . Colon polyps Mother   . Breast cancer Mother   . Hypertension Father   . Lupus Cousin        1st cousins, mom's side  . Prostate cancer Paternal  Grandfather   . Healthy Daughter   . Healthy Daughter   . Healthy Son   . Colon cancer Neg Hx   . Esophageal cancer Neg Hx   . Stomach cancer Neg Hx   . Pancreatic cancer Neg Hx     SOCHx:   reports that she has never smoked. She has never used smokeless tobacco. She reports that she drinks alcohol. She reports that she does not use drugs.  ALLERGIES:  No Known Allergies  ROS: Pertinent items noted in HPI and remainder of comprehensive ROS otherwise negative.  HOME MEDS: Current Outpatient Prescriptions on File Prior to Visit  Medication Sig Dispense Refill  . Multiple Vitamin (MULTIVITAMIN) capsule Take 1 capsule by mouth daily.    . [DISCONTINUED] ipratropium (ATROVENT) 0.03 % nasal spray Place 2 sprays into the nose 3 (three) times daily. 30 mL 0   Current Facility-Administered Medications on File Prior to Visit  Medication Dose Route Frequency Provider Last Rate Last Dose  . 0.9 %  sodium chloride infusion  500 mL Intravenous Continuous Nandigam, Kavitha V, MD        LABS/IMAGING: No results found for this or any previous visit (from the past 48 hour(s)). No results found.  LIPID  PANEL:    Component Value Date/Time   CHOL 142 01/12/2014 1106   TRIG 63.0 01/12/2014 1106   TRIG 65 07/05/2007 0000   HDL 45.80 01/12/2014 1106   CHOLHDL 3 01/12/2014 1106   VLDL 12.6 01/12/2014 1106   LDLCALC 84 01/12/2014 1106    WEIGHTS: Wt Readings from Last 3 Encounters:  11/17/16 173 lb (78.5 kg)  11/07/16 175 lb (79.4 kg)  10/27/16 175 lb (79.4 kg)    VITALS: BP 118/70   Pulse 69   Ht 5\' 4"  (1.626 m)   Wt 173 lb (78.5 kg)   BMI 29.70 kg/m   EXAM: General appearance: alert and no distress Neck: no carotid bruit, no JVD and thyroid not enlarged, symmetric, no tenderness/mass/nodules Lungs: clear to auscultation bilaterally Heart: regular rate and rhythm Abdomen: soft, non-tender; bowel sounds normal; no masses,  no organomegaly Extremities: extremities normal,  atraumatic, no cyanosis or edema Pulses: 2+ and symmetric Skin: Skin color, texture, turgor normal. No rashes or lesions Neurologic: Grossly normal Psych: Mildly anxious  EKG: Normal sinus rhythm at 69, nonspecific T wave changes - personally reviewed  ASSESSMENT: 1. Atypical chest pain 2. Abnormal EKG  PLAN: 1.   Brandy Salas's pain is atypical and has not occurred recently. It's not always associated with exertion or relieved by rest however it was noted during exercise. The symptoms are sometimes bilateral but to radiate to the back. Her EKG is abnormal today showing some nonspecific T-wave changes. These are nonspecific for ischemia however could be suggestive of this in the setting of pain. I offered plain exercise treadmill stress testing to her as she is anticipating restarting exercise and more higher capacity exercise, based on her exercise related chest discomfort, abnormal EKG and family history of coronary disease.  I'll contact her with those results and if abnormal we'll see her back for further testing.  Thanks for the kind referral.  Pixie Casino, MD, [place]  Attending Cardiologist  Direct Dial: [phone]  Fax: [phone]  Website:  [email or web address] 11/20/2016, 4:05 PM

## 2016-12-01 ENCOUNTER — Telehealth (HOSPITAL_COMMUNITY): Payer: Self-pay

## 2016-12-01 NOTE — Telephone Encounter (Signed)
Encounter complete. 

## 2016-12-05 ENCOUNTER — Ambulatory Visit (HOSPITAL_COMMUNITY)
Admission: RE | Admit: 2016-12-05 | Discharge: 2016-12-05 | Disposition: A | Discharge status: Home / Self Care | Payer: 59 | Source: Ambulatory Visit | Attending: Cardiovascular Disease | Admitting: Cardiovascular Disease

## 2016-12-05 DIAGNOSIS — R079 Chest pain, unspecified: Secondary | ICD-10-CM | POA: Diagnosis not present

## 2016-12-05 DIAGNOSIS — R9431 Abnormal electrocardiogram [ECG] [EKG]: Secondary | ICD-10-CM | POA: Diagnosis not present

## 2016-12-05 LAB — EXERCISE TOLERANCE TEST
CHL CUP MPHR: 167 {beats}/min
CSEPEDS: 18 s
Estimated workload: 10.5 METS
Exercise duration (min): 9 min
Peak HR: 160 {beats}/min
Percent HR: 95 %
RPE: 18
Rest HR: 77 {beats}/min

## 2016-12-11 ENCOUNTER — Encounter: Payer: Self-pay | Admitting: Gastroenterology

## 2016-12-11 ENCOUNTER — Ambulatory Visit (INDEPENDENT_AMBULATORY_CARE_PROVIDER_SITE_OTHER): Payer: 59 | Admitting: Gastroenterology

## 2016-12-11 VITALS — BP 144/86 | HR 80 | Ht 64.0 in | Wt 175.1 lb

## 2016-12-11 DIAGNOSIS — K641 Second degree hemorrhoids: Secondary | ICD-10-CM | POA: Diagnosis not present

## 2016-12-11 NOTE — Progress Notes (Signed)
PROCEDURE NOTE: The patient presents with symptomatic grade II  hemorrhoids, requesting rubber band ligation of his/her hemorrhoidal disease.  All risks, benefits and alternative forms of therapy were described and informed consent was obtained.   The anorectum was pre-medicated with 0.125% Nitroglycerine and Recticare The decision was made to band the Left lateral internal hemorrhoid, and the [facility] System was used to perform band ligation without complication.  Digital anorectal examination was then performed to assure proper positioning of the band, and to adjust the banded tissue as required.  The patient was discharged home without pain or other issues.  Dietary and behavioral recommendations were given and along with follow-up instructions.    The patient will return  for  follow-up and possible additional banding as required. No complications were encountered and the patient tolerated the procedure well.  K. Veena Nandigam , MD [phone] Mon-Fri 8a-5p [phone] after 5p, weekends, holidays 

## 2016-12-11 NOTE — Patient Instructions (Signed)

## 2016-12-19 DIAGNOSIS — R682 Dry mouth, unspecified: Secondary | ICD-10-CM | POA: Diagnosis not present

## 2016-12-19 DIAGNOSIS — Z131 Encounter for screening for diabetes mellitus: Secondary | ICD-10-CM | POA: Diagnosis not present

## 2016-12-31 DIAGNOSIS — Z23 Encounter for immunization: Secondary | ICD-10-CM | POA: Diagnosis not present

## 2017-01-01 ENCOUNTER — Ambulatory Visit (INDEPENDENT_AMBULATORY_CARE_PROVIDER_SITE_OTHER): Payer: 59 | Admitting: Gastroenterology

## 2017-01-01 ENCOUNTER — Encounter: Payer: Self-pay | Admitting: Gastroenterology

## 2017-01-01 VITALS — BP 128/78 | HR 80 | Ht 64.0 in | Wt 176.0 lb

## 2017-01-01 DIAGNOSIS — K641 Second degree hemorrhoids: Secondary | ICD-10-CM

## 2017-01-01 NOTE — Patient Instructions (Signed)

## 2017-01-01 NOTE — Progress Notes (Signed)
PROCEDURE NOTE: The patient presents with symptomatic grade II hemorrhoids, requesting rubber band ligation of his/her hemorrhoidal disease.  All risks, benefits and alternative forms of therapy were described and informed consent was obtained.   The anorectum was pre-medicated with 0.125% and recticare The decision was made to band the Right posterior nternal hemorrhoid, and the Burn[place] was used to perform band ligation without complication.  Digital anorectal examination was then performed to assure proper positioning of the band, and to adjust the banded tissue as required.  The patient was discharged home without pain or other issues.  Dietary and behavioral recommendations were given and along with follow-up instructions.     The following adjunctive treatments were recommended:  Benefiber  The patient will return in  for  follow-up and possible additional banding as required. No complications were encountered and the patient tolerated the procedure well.  Damaris Hippo , MD [phone] Mon-Fri 8a-5p [phone] after 5p, weekends, holidays

## 2017-01-12 DIAGNOSIS — R82998 Other abnormal findings in urine: Secondary | ICD-10-CM | POA: Diagnosis not present

## 2017-01-12 DIAGNOSIS — Z0000 Encounter for general adult medical examination without abnormal findings: Secondary | ICD-10-CM | POA: Diagnosis not present

## 2017-01-19 DIAGNOSIS — J302 Other seasonal allergic rhinitis: Secondary | ICD-10-CM | POA: Diagnosis not present

## 2017-01-19 DIAGNOSIS — D72819 Decreased white blood cell count, unspecified: Secondary | ICD-10-CM | POA: Diagnosis not present

## 2017-01-19 DIAGNOSIS — K648 Other hemorrhoids: Secondary | ICD-10-CM | POA: Diagnosis not present

## 2017-01-19 DIAGNOSIS — Z0000 Encounter for general adult medical examination without abnormal findings: Secondary | ICD-10-CM | POA: Diagnosis not present

## 2017-10-31 DIAGNOSIS — Z1231 Encounter for screening mammogram for malignant neoplasm of breast: Secondary | ICD-10-CM | POA: Diagnosis not present

## 2017-10-31 DIAGNOSIS — Z01419 Encounter for gynecological examination (general) (routine) without abnormal findings: Secondary | ICD-10-CM | POA: Diagnosis not present

## 2017-10-31 DIAGNOSIS — Z124 Encounter for screening for malignant neoplasm of cervix: Secondary | ICD-10-CM | POA: Diagnosis not present

## 2017-12-19 DIAGNOSIS — Z6829 Body mass index (BMI) 29.0-29.9, adult: Secondary | ICD-10-CM | POA: Diagnosis not present

## 2017-12-19 DIAGNOSIS — Z23 Encounter for immunization: Secondary | ICD-10-CM | POA: Diagnosis not present

## 2017-12-19 DIAGNOSIS — M25551 Pain in right hip: Secondary | ICD-10-CM | POA: Diagnosis not present

## 2018-01-16 DIAGNOSIS — Z0000 Encounter for general adult medical examination without abnormal findings: Secondary | ICD-10-CM | POA: Diagnosis not present

## 2018-01-16 DIAGNOSIS — R82998 Other abnormal findings in urine: Secondary | ICD-10-CM | POA: Diagnosis not present

## 2018-01-25 DIAGNOSIS — N183 Chronic kidney disease, stage 3 (moderate): Secondary | ICD-10-CM | POA: Diagnosis not present

## 2018-01-25 DIAGNOSIS — Z1389 Encounter for screening for other disorder: Secondary | ICD-10-CM | POA: Diagnosis not present

## 2018-01-25 DIAGNOSIS — M25551 Pain in right hip: Secondary | ICD-10-CM | POA: Diagnosis not present

## 2018-01-25 DIAGNOSIS — Z0000 Encounter for general adult medical examination without abnormal findings: Secondary | ICD-10-CM | POA: Diagnosis not present

## 2018-03-17 ENCOUNTER — Ambulatory Visit (HOSPITAL_COMMUNITY)
Admission: EM | Admit: 2018-03-17 | Discharge: 2018-03-17 | Disposition: A | Discharge status: Home / Self Care | Payer: 59

## 2018-03-17 ENCOUNTER — Encounter (HOSPITAL_COMMUNITY): Payer: Self-pay | Admitting: Emergency Medicine

## 2018-03-17 DIAGNOSIS — R030 Elevated blood-pressure reading, without diagnosis of hypertension: Secondary | ICD-10-CM | POA: Insufficient documentation

## 2018-03-17 DIAGNOSIS — B9789 Other viral agents as the cause of diseases classified elsewhere: Secondary | ICD-10-CM | POA: Diagnosis not present

## 2018-03-17 DIAGNOSIS — J069 Acute upper respiratory infection, unspecified: Secondary | ICD-10-CM | POA: Insufficient documentation

## 2018-03-17 DIAGNOSIS — R0981 Nasal congestion: Secondary | ICD-10-CM | POA: Insufficient documentation

## 2018-03-17 MED ORDER — BENZONATATE 100 MG PO CAPS: 100.0000 mg | ORAL_CAPSULE | Freq: Three times a day (TID) | ORAL | 0 refills | Status: AC | PRN

## 2018-03-17 MED ORDER — PROMETHAZINE-DM 6.25-15 MG/5ML PO SYRP: 5.0000 mL | ORAL_SOLUTION | Freq: Two times a day (BID) | ORAL | 0 refills | Status: AC | PRN

## 2018-03-17 NOTE — Discharge Instructions (Addendum)
For sore throat try using a honey-based tea. Use 3 teaspoons of honey with juice squeezed from half lemon. Place shaved pieces of ginger into 1/2-1 cup of water and warm over stove top. Then mix the ingredients and repeat every 4 hours as needed. Start Zyrtec, Allegra or Claritin and use this with (Sudafed) pseudoephedrine 60mg  2-3 times a day as needed. Hydrate well with at least 2 liters a day.

## 2018-03-17 NOTE — ED Triage Notes (Signed)
Pt c/o cough x1 week, wants to be sure she doesn't have bronchitis.

## 2018-03-17 NOTE — ED Provider Notes (Signed)
MRN: [id number] DOB: Dec 04, 1965  Subjective:   Brandy Salas is a 53 y.o. female presenting for 1 week history of persistent productive cough, chest congestion. Has tried Robitussin, Mucinex. She called her PCP and requested azithromycin by phone which she was given. Feels that azithromycin helped some. Denies history of asthma. Patient is not smoker. Has seasonal allergies. Is not currently using Flonase. Denies fever, ear pain, chest pain, n/v, abdominal pain.    Current Facility-Administered Medications:  .  0.9 %  sodium chloride infusion, 500 mL, Intravenous, Continuous, Nandigam, Kavitha V, MD  Current Outpatient Medications:  .  azithromycin (ZITHROMAX) 250 MG tablet, Take 250 mg by mouth daily., Disp: , Rfl:  .  cetirizine (ZYRTEC) 10 MG tablet, Take 10 mg by mouth daily., Disp: , Rfl:  .  fluticasone (FLONASE) 50 MCG/ACT nasal spray, Place 2 sprays into both nostrils daily., Disp: , Rfl:  .  Multiple Vitamin (MULTIVITAMIN) capsule, Take 1 capsule by mouth every other day as needed. , Disp: , Rfl:     No Known Allergies   Past Medical History:  Diagnosis Date  . Anemia   . Arm fracture    as a child  . Nocturia   . Seasonal allergies   . THROMBOCYTOPENIA    pt denies     Past Surgical History:  Procedure Laterality Date  . CESAREAN SECTION    . COLONOSCOPY    . SVD     spontaneous vaginal delivery    Objective:   Vitals: BP (!) 150/84   Pulse 70   Temp (!) 97 F (36.1 C) (Temporal)   Resp 20   SpO2 100%   Physical Exam Constitutional:      General: She is not in acute distress.    Appearance: Normal appearance. She is well-developed. She is not ill-appearing, toxic-appearing or diaphoretic.  HENT:     Head: Normocephalic and atraumatic.     Right Ear: Tympanic membrane, ear canal and external ear normal.     Left Ear: Tympanic membrane, ear canal and external ear normal.     Nose: Rhinorrhea present. No congestion.     Mouth/Throat:   Mouth: Mucous membranes are moist.     Pharynx: No oropharyngeal exudate or posterior oropharyngeal erythema.     Comments: Thick postnasal drainage and oropharynx noted. Eyes:     General: No scleral icterus.       Right eye: No discharge.        Left eye: No discharge.     Extraocular Movements: Extraocular movements intact.     Pupils: Pupils are equal, round, and reactive to light.  Cardiovascular:     Rate and Rhythm: Normal rate and regular rhythm.     Pulses: Normal pulses.     Heart sounds: Normal heart sounds. No murmur. No friction rub. No gallop.   Pulmonary:     Effort: Pulmonary effort is normal. No respiratory distress.     Breath sounds: Normal breath sounds. No stridor. No wheezing, rhonchi or rales.  Skin:    General: Skin is warm and dry.     Findings: No rash.  Neurological:     Mental Status: She is alert and oriented to person, place, and time.  Psychiatric:        Mood and Affect: Mood normal.        Behavior: Behavior normal.        Thought Content: Thought content normal.     Assessment and Plan :  Viral URI with cough  Nasal congestion  Elevated blood pressure reading  Patient likely had viral URI from the beginning.  However, given that she was prescribed azithromycin without being seen by her PCP I counseled that she can go ahead and finish that.  Recommend supportive care otherwise as she has reassuring physical exam findings.  Patient declined prednisone and albuterol inhaler for now. Counseled patient on potential for adverse effects with medications prescribed today, patient verbalized understanding. Return-to-clinic precautions discussed, patient verbalized understanding.   Jaynee Eagles, [place] 03/17/18 [phone]

## 2018-03-17 NOTE — ED Triage Notes (Signed)
Pt states she also called her PCP and was given a Z pack, states its the last day of her Z pack.

## 2022-01-09 ENCOUNTER — Encounter: Payer: Self-pay | Admitting: Gastroenterology

## 2022-01-10 ENCOUNTER — Telehealth: Payer: Self-pay | Admitting: Gastroenterology

## 2022-01-10 NOTE — Telephone Encounter (Signed)
Patient called she would to know why her colon year has changed. Requesting a call back.
